# Patient Record
Sex: Female | Born: 1960 | Race: Black or African American | Hispanic: No | Marital: Married | Smoking: Never smoker
Health system: Southern US, Community
[De-identification: ages and names within clinical notes are randomized; demographics above are authoritative.]

---

## 1998-08-15 ENCOUNTER — Inpatient Hospital Stay (HOSPITAL_COMMUNITY): Admission: AD | Admit: 1998-08-15 | Discharge: 1998-08-15 | Payer: Self-pay | Admitting: *Deleted

## 1999-06-28 ENCOUNTER — Encounter: Payer: Self-pay | Admitting: General Surgery

## 1999-06-28 ENCOUNTER — Ambulatory Visit (HOSPITAL_COMMUNITY): Admission: RE | Admit: 1999-06-28 | Discharge: 1999-06-28 | Payer: Self-pay | Admitting: General Surgery

## 1999-06-28 ENCOUNTER — Encounter (INDEPENDENT_AMBULATORY_CARE_PROVIDER_SITE_OTHER): Payer: Self-pay

## 1999-09-23 ENCOUNTER — Inpatient Hospital Stay (HOSPITAL_COMMUNITY): Admission: AD | Admit: 1999-09-23 | Discharge: 1999-09-25 | Payer: Self-pay | Admitting: Obstetrics and Gynecology

## 2000-04-24 ENCOUNTER — Encounter: Payer: Self-pay | Admitting: Emergency Medicine

## 2000-04-24 ENCOUNTER — Encounter: Admission: RE | Admit: 2000-04-24 | Discharge: 2000-04-24 | Payer: Self-pay | Admitting: Emergency Medicine

## 2001-01-10 ENCOUNTER — Inpatient Hospital Stay (HOSPITAL_COMMUNITY): Admission: AD | Admit: 2001-01-10 | Discharge: 2001-01-12 | Payer: Self-pay | Admitting: Obstetrics and Gynecology

## 2001-02-14 ENCOUNTER — Encounter: Payer: Self-pay | Admitting: Obstetrics & Gynecology

## 2001-02-15 ENCOUNTER — Inpatient Hospital Stay (HOSPITAL_COMMUNITY): Admission: AD | Admit: 2001-02-15 | Discharge: 2001-02-20 | Payer: Self-pay | Admitting: Obstetrics & Gynecology

## 2001-09-11 ENCOUNTER — Other Ambulatory Visit: Admission: RE | Admit: 2001-09-11 | Discharge: 2001-09-11 | Payer: Self-pay | Admitting: Obstetrics and Gynecology

## 2001-09-16 ENCOUNTER — Encounter: Admission: RE | Admit: 2001-09-16 | Discharge: 2001-09-16 | Payer: Self-pay | Admitting: Obstetrics and Gynecology

## 2001-09-16 ENCOUNTER — Encounter: Payer: Self-pay | Admitting: Obstetrics and Gynecology

## 2003-03-21 ENCOUNTER — Encounter: Admission: RE | Admit: 2003-03-21 | Discharge: 2003-03-21 | Payer: Self-pay | Admitting: Emergency Medicine

## 2004-06-27 ENCOUNTER — Encounter: Admission: RE | Admit: 2004-06-27 | Discharge: 2004-06-27 | Payer: Self-pay | Admitting: Emergency Medicine

## 2005-03-27 ENCOUNTER — Emergency Department (HOSPITAL_COMMUNITY): Admission: EM | Admit: 2005-03-27 | Discharge: 2005-03-27 | Payer: Self-pay | Admitting: Emergency Medicine

## 2005-07-26 ENCOUNTER — Other Ambulatory Visit: Admission: RE | Admit: 2005-07-26 | Discharge: 2005-07-26 | Payer: Self-pay | Admitting: Obstetrics and Gynecology

## 2005-07-29 ENCOUNTER — Ambulatory Visit (HOSPITAL_COMMUNITY): Admission: RE | Admit: 2005-07-29 | Discharge: 2005-07-29 | Payer: Self-pay | Admitting: Obstetrics and Gynecology

## 2005-09-05 ENCOUNTER — Encounter: Admission: RE | Admit: 2005-09-05 | Discharge: 2005-09-05 | Payer: Self-pay | Admitting: Obstetrics and Gynecology

## 2005-12-29 ENCOUNTER — Emergency Department (HOSPITAL_COMMUNITY): Admission: EM | Admit: 2005-12-29 | Discharge: 2005-12-29 | Payer: Self-pay | Admitting: Family Medicine

## 2006-09-10 ENCOUNTER — Encounter: Admission: RE | Admit: 2006-09-10 | Discharge: 2006-09-10 | Payer: Self-pay | Admitting: Obstetrics and Gynecology

## 2006-11-20 ENCOUNTER — Encounter: Admission: RE | Admit: 2006-11-20 | Discharge: 2006-11-20 | Payer: Self-pay | Admitting: Occupational Medicine

## 2007-06-01 ENCOUNTER — Encounter: Admission: RE | Admit: 2007-06-01 | Discharge: 2007-06-01 | Payer: Self-pay | Admitting: Emergency Medicine

## 2007-11-09 ENCOUNTER — Encounter: Admission: RE | Admit: 2007-11-09 | Discharge: 2007-11-09 | Payer: Self-pay | Admitting: Emergency Medicine

## 2008-02-26 ENCOUNTER — Encounter: Admission: RE | Admit: 2008-02-26 | Discharge: 2008-02-26 | Payer: Self-pay | Admitting: Emergency Medicine

## 2009-03-13 ENCOUNTER — Emergency Department (HOSPITAL_COMMUNITY): Admission: EM | Admit: 2009-03-13 | Discharge: 2009-03-13 | Payer: Self-pay | Admitting: Family Medicine

## 2009-06-28 ENCOUNTER — Encounter: Admission: RE | Admit: 2009-06-28 | Discharge: 2009-06-28 | Payer: Self-pay | Admitting: Geriatric Medicine

## 2010-03-19 ENCOUNTER — Encounter
Admission: RE | Admit: 2010-03-19 | Discharge: 2010-03-19 | Payer: Self-pay | Source: Home / Self Care | Attending: Internal Medicine | Admitting: Internal Medicine

## 2010-04-29 ENCOUNTER — Encounter: Payer: Self-pay | Admitting: Obstetrics and Gynecology

## 2010-04-29 ENCOUNTER — Encounter: Payer: Self-pay | Admitting: Emergency Medicine

## 2010-08-24 NOTE — Discharge Summary (Signed)
[facility]  Patient:    Yolanda Roberson, Yolanda Roberson Visit Number: [id number] MRN: [id number]          Service Type: MED Location: [phone] Attending Physician:  Mickle Mallory Admit Date:  02/14/2001 Discharge Date: 02/20/2001                             Discharge Summary  DISCHARGE DIAGNOSES:          1. Postpartum endometritis.  PROCEDURE:                    IV antibiotic therapy, surgery consult, CT scan of abdomen.  HISTORY OF PRESENT ILLNESS:   Yolanda Roberson is a 50 year old black female, gravida 5, para 5, who had an uncomplicated delivery on January 10, 2001, by Yolanda Roberson, M.D.  The patient received Depo-Provera 150 mg IM prior to leaving on discharge.  The patient was doing well until approximately one day prior to admission when she began to have lower abdominal pain with associated nausea and vomiting.  When the patient presented to the emergency room, she was complaining of significant abdominal pain which was most significant in the right lower quadrant.  The patient was evaluated by Dr. Aldona Bar.  Her temperature on admission was 101.  Of significance on her abdominal examination was that her bowel sounds were decreased and that she was tender in the right lower quadrant, left lower quadrant, and less tender in the upper quadrants.  There was also rebound noted in the lower quadrants.  The pelvic examination was reported as being essentially normal.  The initial labs returned with a white count of 6.5, however, there was a left shift noted. Liver enzymes were normal.  Urinalysis was normal.  The patient also had a GC and Chlamydia obtained which were negative.  It was felt that the patients presentation was not consistent with an endometritis and it was worried that she might have an early appendicitis.  Surgery consult was obtained and a CT scan was ordered.  The CT scan revealed no evidence of appendicitis.  HOSPITAL COURSE:               During the hospitalization, for the first 48 hours, the patient was spiking temperatures up to 104.  On hospital day #2 tpx had blood cultures obtained and was begun on Cefotan for possible endometritis versus a viral syndrome.  The CT scan was also reviewed for septic pelvic thrombophlebitis which was negative.  While on the antibiotics, the patient defervesced and her left shift resolved and on hospital day #6, she was discharged to home with Darvocet to take p.r.n. and Keflex 500 mg q.i.d. for 5 days.  The patient was instructed to follow up in the office in 10 days. Attending Physician:  Mickle Mallory DD:  02/20/01 TD:  02/20/01 Job: [id number]

## 2011-06-06 ENCOUNTER — Ambulatory Visit
Admission: RE | Admit: 2011-06-06 | Discharge: 2011-06-06 | Disposition: A | Discharge status: Home / Self Care | Payer: BC Managed Care – PPO | Source: Ambulatory Visit | Attending: Internal Medicine | Admitting: Internal Medicine

## 2011-06-06 ENCOUNTER — Other Ambulatory Visit: Payer: Self-pay | Admitting: Internal Medicine

## 2011-06-06 DIAGNOSIS — Z1231 Encounter for screening mammogram for malignant neoplasm of breast: Secondary | ICD-10-CM

## 2011-07-24 ENCOUNTER — Telehealth: Payer: Self-pay

## 2011-07-24 NOTE — Telephone Encounter (Signed)
LM for pt to cb to discuss lab results. JO CMA

## 2011-08-02 ENCOUNTER — Telehealth: Payer: Self-pay

## 2011-08-02 NOTE — Telephone Encounter (Signed)
Late entry from 07/31/2011 re test results. Pt notified of all std screening results. Yolanda Roberson A

## 2012-08-20 ENCOUNTER — Other Ambulatory Visit: Payer: Self-pay

## 2012-08-20 DIAGNOSIS — Z1231 Encounter for screening mammogram for malignant neoplasm of breast: Secondary | ICD-10-CM

## 2012-09-23 ENCOUNTER — Ambulatory Visit: Payer: BC Managed Care – PPO

## 2012-09-24 ENCOUNTER — Ambulatory Visit
Admission: RE | Admit: 2012-09-24 | Discharge: 2012-09-24 | Disposition: A | Discharge status: Home / Self Care | Payer: BC Managed Care – PPO | Source: Ambulatory Visit

## 2012-09-24 DIAGNOSIS — Z1231 Encounter for screening mammogram for malignant neoplasm of breast: Secondary | ICD-10-CM

## 2013-09-03 ENCOUNTER — Emergency Department (INDEPENDENT_AMBULATORY_CARE_PROVIDER_SITE_OTHER)
Admission: EM | Admit: 2013-09-03 | Discharge: 2013-09-03 | Disposition: A | Discharge status: Home / Self Care | Payer: Worker's Compensation | Source: Home / Self Care | Attending: Emergency Medicine | Admitting: Emergency Medicine

## 2013-09-03 ENCOUNTER — Encounter (HOSPITAL_COMMUNITY): Payer: Self-pay | Admitting: Emergency Medicine

## 2013-09-03 DIAGNOSIS — Y93F2 Activity, caregiving, lifting: Secondary | ICD-10-CM

## 2013-09-03 DIAGNOSIS — M5430 Sciatica, unspecified side: Secondary | ICD-10-CM

## 2013-09-03 MED ORDER — TRAMADOL HCL 50 MG PO TABS: 100.0000 mg | ORAL_TABLET | Freq: Three times a day (TID) | ORAL | Status: DC | PRN

## 2013-09-03 MED ORDER — MELOXICAM 15 MG PO TABS: 15.0000 mg | ORAL_TABLET | Freq: Every day | ORAL | Status: DC

## 2013-09-03 MED ORDER — KETOROLAC TROMETHAMINE 60 MG/2ML IM SOLN
60.0000 mg | Freq: Once | INTRAMUSCULAR | Status: AC
  Administered 2013-09-03: 60 mg via INTRAMUSCULAR

## 2013-09-03 MED ORDER — METHYLPREDNISOLONE ACETATE 80 MG/ML IJ SUSP
INTRAMUSCULAR | Status: AC
  Filled 2013-09-03: qty 1

## 2013-09-03 MED ORDER — KETOROLAC TROMETHAMINE 60 MG/2ML IM SOLN
INTRAMUSCULAR | Status: AC
  Filled 2013-09-03: qty 2

## 2013-09-03 MED ORDER — METHOCARBAMOL 500 MG PO TABS: 500.0000 mg | ORAL_TABLET | Freq: Three times a day (TID) | ORAL | Status: DC

## 2013-09-03 MED ORDER — METHYLPREDNISOLONE ACETATE 80 MG/ML IJ SUSP
80.0000 mg | Freq: Once | INTRAMUSCULAR | Status: AC
  Administered 2013-09-03: 80 mg via INTRAMUSCULAR

## 2013-09-03 NOTE — ED Provider Notes (Signed)
Chief Complaint   Chief Complaint  Patient presents with  . 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 Immediately thereafter she noted pain in her right lower back with radiation down her right leg as far as the foot with numbness and tingling in the leg. There is no muscle weakness or foot drop. She denies any bladder or bowel dysfunction or saddle anesthesia. She's had no bowel pain, no fever, chills, weight loss. The pain is worse with bending, lifting, twisting. Nothing makes it better. She's had similar back injuries in the past but they have gotten better with time.  Review of Systems   Other than as noted above, the patient denies any of the following symptoms: Systemic:  No fever, chills, or unexplained weight loss. GI:  No abdominal painor incontinence of bowel. GU:  No dysuria, frequency, urgency, or hematuria. No incontinence of urine or urinary retention.  M-S:  No neck pain or arthritis. Neuro:  No paresthesias, headache, saddle anesthesia, muscular weakness, or progressive neurological deficit.  PMFSH   Past medical history, family history, social history, meds, and allergies were reviewed. Specifically, there is no history of cancer, major trauma, osteoporosis, immunosuppression, or HIV infection.   Physical Examination    Vital signs:  BP 117/81  Pulse 50  Temp(Src) 98.4 F (36.9 C) (Oral)  Resp 18  SpO2 100% General:  Alert, oriented, in no distress. Abdomen:  Soft, non-tender.  No organomegaly or mass.  No pulsatile midline abdominal mass or bruit. Back:  She has pain to palpation in her right lower lumbar area. Her back has a limited range of motion with 30 of forward flexion, 10 of backward flexion, 20 of lateral bending, and 30 of rotation with pain. Straight leg raising was positive on the right with pain only in the back and not down the leg and  negative on the left. Neuro:  Normal muscle strength, sensations and DTRs. Extremities: Pedal pulses were full, there was no edema. Skin:  Clear, warm and dry.  No rash.  Course in Urgent Care Center   Given Toradol 60 mg IM and Depo-Medrol 80 mg IM.    Assessment   The encounter diagnosis was Sciatica.  No evidence of cauda equina syndrome.  Plan     1.  Meds:  The following meds were prescribed:   Discharge Medication List as of 09/03/2013  7:34 PM    START taking these medications   Details  meloxicam (MOBIC) 15 MG tablet Take 1 tablet (15 mg total) by mouth daily., Starting 09/03/2013, Until Discontinued, Normal    methocarbamol (ROBAXIN) 500 MG tablet Take 1 tablet (500 mg total) by mouth 3 (three) times daily., Starting 09/03/2013, Until Discontinued, Normal    traMADol (ULTRAM) 50 MG tablet Take 2 tablets (100 mg total) by mouth every 8 (eight) hours as needed., Starting 09/03/2013, Until Discontinued, Normal        2.  Patient Education/Counseling:  The patient was given appropriate handouts, self care instructions, and instructed in symptomatic relief. The patient was encouraged to try to be as active as possible and given some exercises to do followed by moist heat.   3.  Follow up:  The patient was told to follow up here if no better in 3 to 4 days, or sooner if becoming worse in any way, and given some red flag symptoms  such as worsening pain or new neurological symptoms which would prompt immediate return.  Follow up early next week at occupational health clinic.     Reuben Likesavid C Nakaila Freeze, MD 09/03/13 2128

## 2013-09-03 NOTE — Discharge Instructions (Signed)
Do exercises twice daily followed by moist heat for 15 minutes. ° ° ° ° ° °Try to be as active as possible. ° °If no better in 2 weeks, follow up with orthopedist. ° ° °

## 2013-09-03 NOTE — ED Notes (Signed)
Patient states that she hurt her back at work this morning; states pain in lower back; states she was picking up a patient at work when injuring back.

## 2015-05-30 ENCOUNTER — Other Ambulatory Visit: Payer: Self-pay | Admitting: Occupational Medicine

## 2015-05-30 ENCOUNTER — Ambulatory Visit: Payer: Self-pay

## 2015-05-30 DIAGNOSIS — M25531 Pain in right wrist: Secondary | ICD-10-CM

## 2015-07-17 ENCOUNTER — Other Ambulatory Visit: Payer: Self-pay | Admitting: Gastroenterology

## 2015-07-17 DIAGNOSIS — R933 Abnormal findings on diagnostic imaging of other parts of digestive tract: Secondary | ICD-10-CM

## 2015-08-11 ENCOUNTER — Ambulatory Visit
Admission: RE | Admit: 2015-08-11 | Discharge: 2015-08-11 | Disposition: A | Discharge status: Home / Self Care | Payer: BLUE CROSS/BLUE SHIELD | Source: Ambulatory Visit | Attending: Gastroenterology | Admitting: Gastroenterology

## 2015-08-11 DIAGNOSIS — R933 Abnormal findings on diagnostic imaging of other parts of digestive tract: Secondary | ICD-10-CM

## 2018-11-26 DIAGNOSIS — Z03818 Encounter for observation for suspected exposure to other biological agents ruled out: Secondary | ICD-10-CM | POA: Diagnosis not present

## 2019-02-13 DIAGNOSIS — Z03818 Encounter for observation for suspected exposure to other biological agents ruled out: Secondary | ICD-10-CM | POA: Diagnosis not present

## 2019-02-24 DIAGNOSIS — Z03818 Encounter for observation for suspected exposure to other biological agents ruled out: Secondary | ICD-10-CM | POA: Diagnosis not present

## 2019-03-01 DIAGNOSIS — Z03818 Encounter for observation for suspected exposure to other biological agents ruled out: Secondary | ICD-10-CM | POA: Diagnosis not present

## 2019-03-15 DIAGNOSIS — Z03818 Encounter for observation for suspected exposure to other biological agents ruled out: Secondary | ICD-10-CM | POA: Diagnosis not present

## 2019-03-29 DIAGNOSIS — Z03818 Encounter for observation for suspected exposure to other biological agents ruled out: Secondary | ICD-10-CM | POA: Diagnosis not present

## 2019-04-06 DIAGNOSIS — Z20828 Contact with and (suspected) exposure to other viral communicable diseases: Secondary | ICD-10-CM | POA: Diagnosis not present

## 2019-04-21 DIAGNOSIS — Z03818 Encounter for observation for suspected exposure to other biological agents ruled out: Secondary | ICD-10-CM | POA: Diagnosis not present

## 2019-04-26 DIAGNOSIS — Z03818 Encounter for observation for suspected exposure to other biological agents ruled out: Secondary | ICD-10-CM | POA: Diagnosis not present

## 2020-03-20 DIAGNOSIS — Z1231 Encounter for screening mammogram for malignant neoplasm of breast: Secondary | ICD-10-CM | POA: Diagnosis not present

## 2020-03-21 DIAGNOSIS — Z780 Asymptomatic menopausal state: Secondary | ICD-10-CM | POA: Diagnosis not present

## 2020-03-21 DIAGNOSIS — Z30432 Encounter for removal of intrauterine contraceptive device: Secondary | ICD-10-CM | POA: Diagnosis not present

## 2020-03-27 DIAGNOSIS — Z01419 Encounter for gynecological examination (general) (routine) without abnormal findings: Secondary | ICD-10-CM | POA: Diagnosis not present

## 2020-03-27 DIAGNOSIS — E559 Vitamin D deficiency, unspecified: Secondary | ICD-10-CM | POA: Diagnosis not present

## 2020-03-27 DIAGNOSIS — Z124 Encounter for screening for malignant neoplasm of cervix: Secondary | ICD-10-CM | POA: Diagnosis not present

## 2020-03-27 DIAGNOSIS — Z139 Encounter for screening, unspecified: Secondary | ICD-10-CM | POA: Diagnosis not present

## 2020-08-17 ENCOUNTER — Other Ambulatory Visit: Payer: Self-pay | Admitting: Family Medicine

## 2020-08-17 ENCOUNTER — Ambulatory Visit
Admission: RE | Admit: 2020-08-17 | Discharge: 2020-08-17 | Disposition: A | Discharge status: Home / Self Care | Payer: BC Managed Care – PPO | Source: Ambulatory Visit | Attending: Family Medicine | Admitting: Family Medicine

## 2020-08-17 DIAGNOSIS — R519 Headache, unspecified: Secondary | ICD-10-CM | POA: Diagnosis not present

## 2020-08-17 DIAGNOSIS — Z0000 Encounter for general adult medical examination without abnormal findings: Secondary | ICD-10-CM | POA: Diagnosis not present

## 2020-08-17 DIAGNOSIS — Z23 Encounter for immunization: Secondary | ICD-10-CM | POA: Diagnosis not present

## 2020-08-17 DIAGNOSIS — D164 Benign neoplasm of bones of skull and face: Secondary | ICD-10-CM | POA: Diagnosis not present

## 2020-08-17 DIAGNOSIS — Q799 Congenital malformation of musculoskeletal system, unspecified: Secondary | ICD-10-CM

## 2020-08-17 DIAGNOSIS — Z113 Encounter for screening for infections with a predominantly sexual mode of transmission: Secondary | ICD-10-CM | POA: Diagnosis not present

## 2020-08-21 DIAGNOSIS — Z0000 Encounter for general adult medical examination without abnormal findings: Secondary | ICD-10-CM | POA: Diagnosis not present

## 2020-08-21 DIAGNOSIS — Z1159 Encounter for screening for other viral diseases: Secondary | ICD-10-CM | POA: Diagnosis not present

## 2020-08-21 DIAGNOSIS — Z113 Encounter for screening for infections with a predominantly sexual mode of transmission: Secondary | ICD-10-CM | POA: Diagnosis not present

## 2020-08-21 DIAGNOSIS — Z13220 Encounter for screening for lipoid disorders: Secondary | ICD-10-CM | POA: Diagnosis not present

## 2020-08-25 DIAGNOSIS — D72819 Decreased white blood cell count, unspecified: Secondary | ICD-10-CM | POA: Diagnosis not present

## 2020-09-15 DIAGNOSIS — E786 Lipoprotein deficiency: Secondary | ICD-10-CM | POA: Diagnosis not present

## 2020-09-15 DIAGNOSIS — R519 Headache, unspecified: Secondary | ICD-10-CM | POA: Diagnosis not present

## 2020-12-20 DIAGNOSIS — Z23 Encounter for immunization: Secondary | ICD-10-CM | POA: Diagnosis not present

## 2021-02-06 DIAGNOSIS — M79604 Pain in right leg: Secondary | ICD-10-CM | POA: Diagnosis not present

## 2021-02-16 DIAGNOSIS — H40003 Preglaucoma, unspecified, bilateral: Secondary | ICD-10-CM | POA: Diagnosis not present

## 2021-03-29 DIAGNOSIS — M79604 Pain in right leg: Secondary | ICD-10-CM | POA: Diagnosis not present

## 2021-04-05 ENCOUNTER — Ambulatory Visit
Admission: RE | Admit: 2021-04-05 | Discharge: 2021-04-05 | Disposition: A | Discharge status: Home / Self Care | Payer: BC Managed Care – PPO | Source: Ambulatory Visit | Attending: Sports Medicine | Admitting: Sports Medicine

## 2021-04-05 ENCOUNTER — Other Ambulatory Visit: Payer: Self-pay | Admitting: Sports Medicine

## 2021-04-05 DIAGNOSIS — M545 Low back pain, unspecified: Secondary | ICD-10-CM

## 2021-04-05 DIAGNOSIS — M25551 Pain in right hip: Secondary | ICD-10-CM

## 2021-04-12 ENCOUNTER — Other Ambulatory Visit: Payer: Self-pay | Admitting: Sports Medicine

## 2021-04-12 DIAGNOSIS — M4316 Spondylolisthesis, lumbar region: Secondary | ICD-10-CM

## 2021-04-26 DIAGNOSIS — M4316 Spondylolisthesis, lumbar region: Secondary | ICD-10-CM | POA: Diagnosis not present

## 2021-04-26 DIAGNOSIS — M5416 Radiculopathy, lumbar region: Secondary | ICD-10-CM | POA: Diagnosis not present

## 2021-05-06 ENCOUNTER — Ambulatory Visit
Admission: RE | Admit: 2021-05-06 | Discharge: 2021-05-06 | Disposition: A | Discharge status: Home / Self Care | Payer: BC Managed Care – PPO | Source: Ambulatory Visit | Attending: Sports Medicine | Admitting: Sports Medicine

## 2021-05-06 DIAGNOSIS — M4316 Spondylolisthesis, lumbar region: Secondary | ICD-10-CM

## 2021-05-24 DIAGNOSIS — M9905 Segmental and somatic dysfunction of pelvic region: Secondary | ICD-10-CM | POA: Diagnosis not present

## 2021-05-24 DIAGNOSIS — M9903 Segmental and somatic dysfunction of lumbar region: Secondary | ICD-10-CM | POA: Diagnosis not present

## 2021-05-24 DIAGNOSIS — M9904 Segmental and somatic dysfunction of sacral region: Secondary | ICD-10-CM | POA: Diagnosis not present

## 2021-05-24 DIAGNOSIS — M5431 Sciatica, right side: Secondary | ICD-10-CM | POA: Diagnosis not present

## 2021-05-28 DIAGNOSIS — M545 Low back pain, unspecified: Secondary | ICD-10-CM | POA: Diagnosis not present

## 2021-05-28 DIAGNOSIS — M79604 Pain in right leg: Secondary | ICD-10-CM | POA: Diagnosis not present

## 2021-06-04 DIAGNOSIS — M545 Low back pain, unspecified: Secondary | ICD-10-CM | POA: Diagnosis not present

## 2021-06-04 DIAGNOSIS — M79604 Pain in right leg: Secondary | ICD-10-CM | POA: Diagnosis not present

## 2021-06-13 DIAGNOSIS — M5416 Radiculopathy, lumbar region: Secondary | ICD-10-CM | POA: Diagnosis not present

## 2021-06-21 DIAGNOSIS — M5416 Radiculopathy, lumbar region: Secondary | ICD-10-CM | POA: Diagnosis not present

## 2021-07-11 DIAGNOSIS — M5416 Radiculopathy, lumbar region: Secondary | ICD-10-CM | POA: Diagnosis not present

## 2021-07-11 DIAGNOSIS — Z6831 Body mass index (BMI) 31.0-31.9, adult: Secondary | ICD-10-CM | POA: Diagnosis not present

## 2021-07-11 DIAGNOSIS — R030 Elevated blood-pressure reading, without diagnosis of hypertension: Secondary | ICD-10-CM | POA: Diagnosis not present

## 2021-08-17 DIAGNOSIS — M5416 Radiculopathy, lumbar region: Secondary | ICD-10-CM | POA: Diagnosis not present

## 2021-08-24 DIAGNOSIS — Z13220 Encounter for screening for lipoid disorders: Secondary | ICD-10-CM | POA: Diagnosis not present

## 2021-08-24 DIAGNOSIS — T7840XA Allergy, unspecified, initial encounter: Secondary | ICD-10-CM | POA: Diagnosis not present

## 2021-08-24 DIAGNOSIS — Z0000 Encounter for general adult medical examination without abnormal findings: Secondary | ICD-10-CM | POA: Diagnosis not present

## 2021-08-24 DIAGNOSIS — H409 Unspecified glaucoma: Secondary | ICD-10-CM | POA: Diagnosis not present

## 2021-11-12 DIAGNOSIS — M4316 Spondylolisthesis, lumbar region: Secondary | ICD-10-CM | POA: Diagnosis not present

## 2021-11-12 DIAGNOSIS — M25851 Other specified joint disorders, right hip: Secondary | ICD-10-CM | POA: Diagnosis not present

## 2021-11-12 DIAGNOSIS — M79604 Pain in right leg: Secondary | ICD-10-CM | POA: Diagnosis not present

## 2021-11-12 DIAGNOSIS — M5416 Radiculopathy, lumbar region: Secondary | ICD-10-CM | POA: Diagnosis not present

## 2021-11-19 NOTE — Therapy (Unsigned)
OUTPATIENT PHYSICAL THERAPY THORACOLUMBAR EVALUATION   Patient Name: Yolanda Roberson MRN: [id number] DOB:1960/07/25, 61 y.o., female Today's Date: 11/26/2021   PT End of Session - 11/26/21 1633     Visit Number 1    Number of Visits 16    Date for PT Re-Evaluation 01/28/22    Authorization Type BCBS COMM PPO    PT Start Time 1515    PT Stop Time 1600    PT Time Calculation (min) 45 min    Activity Tolerance Patient limited by pain    Behavior During Therapy [facility] for tasks assessed/performed            No past medical history on file. No past surgical history on file. There are no problems to display for this patient.   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 She states that it has progressively gotten worse resulting in lateral pain from her foot to her R hip and into her lower back Numbness and tingling present only at the dorsum of her foot. She reports remainder of lateral pain being sharp.   PAIN:  Are you having pain? Yes: NPRS scale: 7/10 Pain location: R lateral leg, dorsum of foot.  Pain description: Sharp pain in lateral R leg, N/T in dorsum of foot. Pulling in HS Aggravating factors: Any  movement.  Relieving factors: Nothing   PRECAUTIONS: None  WEIGHT BEARING RESTRICTIONS No  FALLS:  Has patient fallen in last 6 months? No  LIVING ENVIRONMENT: Lives with: lives with their family Lives in: House/apartment Stairs: Yes: Internal: 12 steps; on right going up Has following equipment at home: None  OCCUPATION: Med taker.   PLOF: Independent  PATIENT GOALS Pt would like to eliminate her pain.    OBJECTIVE:   DIAGNOSTIC FINDINGS:  X- ray done of lumbar spine. No interpretation present yet.   PATIENT  SURVEYS:  FOTO 41%, predicted 47% in 12 visits.   SCREENING FOR RED FLAGS: Bowel or bladder incontinence: No Spinal tumors: No Cauda equina syndrome: No Compression fracture: No Abdominal aneurysm: No  COGNITION:  Overall cognitive status: Within functional limits for tasks assessed     SENSATION: WFL   POSTURE: No Significant postural limitations  PALPATION: Pt tender to palpation along entire R lateral LE.  Tenderness in lateral lumbar paraspinals.   LUMBAR ROM:   Active  A/PROM  eval  Flexion WFL  Extension 10  Right lateral flexion To knee joint line Pain in R side  Left lateral flexion To knee joint line Pain in R side  Right rotation WFL   Left rotation WFL pulling on R side.    (Blank rows = not tested)  LOWER EXTREMITY ROM:     Active  Right eval Left eval  Hip flexion [facility]  Hip extension    Hip abduction    Hip adduction    Hip internal rotation PROM 40 PROM 25  Hip external rotation PROM 2 PROM 15  Knee flexion [facility] WFL  Knee extension WFL WFL   (Blank rows = not tested)  LOWER EXTREMITY MMT:    MMT Right eval Left eval  Hip flexion 3- 3+  Hip extension    Hip abduction    Hip adduction    Hip internal rotation    Hip external rotation    Knee flexion 4- 4+  Knee extension 4- 5   (Blank rows = not tested)  LUMBAR SPECIAL TESTS:  FABER test: Positive and R SCOURS: Negative  FUNCTIONAL TESTS:  None  performed due to time and significant pain.   GAIT: Distance walked: Within clinic (64ft)  Assistive device utilized: None Level of assistance: Complete Independence Comments: Pt with lateral lean to L with decreased step length and stance time.   TODAY'S TREATMENT  Creating, reviewing, and completing below HEP. Heat to R lower back.   PATIENT EDUCATION:  Education details: Educated pt on anatomy and physiology of current symptoms, FOTO, diagnosis, prognosis, HEP,  and POC. Person educated: Patient Education method: Medical illustrator Education comprehension: verbalized understanding and returned demonstration   HOME EXERCISE PROGRAM: Access Code: [email or web address] URL: [email or web address] Date: 11/26/2021 Prepared by: Royal Hawthorn  Exercises - Supine Lower Trunk Rotation  - 1 x daily - 7 x weekly - 3 sets - 10 reps  ASSESSMENT:  CLINICAL IMPRESSION: Patient is a 61 y.o. 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 Pt with decreased lumbar extension, and hip flexion secondary to pain. Pt is very limited with her functional movements, restricting her from participating in daily and recreational tasks. Pt will benefit from skilled PT to address deficits.   OBJECTIVE IMPAIRMENTS Abnormal gait, decreased activity tolerance, decreased balance, decreased endurance, decreased knowledge of condition, decreased mobility, difficulty walking, decreased ROM, decreased strength, obesity, and pain.   ACTIVITY LIMITATIONS carrying, lifting, bending, sitting, standing, squatting, sleeping, stairs, bed mobility, bathing, dressing, and locomotion level  PARTICIPATION LIMITATIONS: meal prep, cleaning, laundry, driving, community activity, and occupation  PERSONAL FACTORS Education and  Profession are also affecting patient's functional outcome.   REHAB POTENTIAL: Good  CLINICAL DECISION MAKING: Evolving/moderate complexity  EVALUATION COMPLEXITY: Moderate   GOALS: Goals reviewed with patient? No  SHORT TERM GOALS: Target date: 12/24/2021  Pt will be I  and compliant with initial HEP. Baseline: Goal status: INITIAL  2.  Pt will report </= 3/10 pain at baseline   Baseline:7/10 Goal status: INITIAL  LONG TERM GOALS: Target date: 01/21/2022  Pt will be independent with advanced HEP to continue to address postural limitations and muscle imbalances. Baseline:  Goal status: INITIAL  2.  Pt will report </= 3/10 pain at baseline    Baseline: Goal status: INITIAL  3.  Pt will improve FOTO function score to no less than 47% as proxy for functional improvement Baseline:  Goal status: INITIAL  4.  Pt will be able to run errands for her family without familiar pain.  Baseline: Unable to do anything beyond work.  Goal status: INITIAL  5.  Pt will improve her MMT's to 4/5 globally.  Baseline: See chart above  Goal status: INITIAL  6.  Pt will ambulate with normal gait pattern.  Baseline:  Goal status: INITIAL   PLAN: PT FREQUENCY: 2x/week  PT DURATION: 8 weeks  PLANNED INTERVENTIONS: Therapeutic exercises, Therapeutic activity, Neuromuscular re-education, Balance training, Gait training, Patient/Family education, Self Care, Joint mobilization, Joint manipulation, Stair training, Aquatic Therapy, Dry Needling, Electrical stimulation, Spinal manipulation, Spinal mobilization, Cryotherapy, Moist heat, Taping, Vasopneumatic device, Traction, Ultrasound, Biofeedback, Ionotophoresis 4mg /ml Dexamethasone, Manual therapy, and Re-evaluation.  PLAN FOR NEXT SESSION: Assess HEP/update PRN, continue to progress functional mobility, strengthen core, paraspinals, proximal hip, and lower extremity muscles. Decrease patients pain and help minimize pain with functional  movements.    , PT 11/26/2021, 4:34 PM

## 2021-11-20 ENCOUNTER — Ambulatory Visit: Payer: Self-pay

## 2021-11-20 ENCOUNTER — Ambulatory Visit: Payer: BC Managed Care – PPO | Admitting: Surgery

## 2021-11-20 DIAGNOSIS — M5416 Radiculopathy, lumbar region: Secondary | ICD-10-CM

## 2021-11-26 ENCOUNTER — Other Ambulatory Visit: Payer: Self-pay

## 2021-11-26 ENCOUNTER — Ambulatory Visit (INDEPENDENT_AMBULATORY_CARE_PROVIDER_SITE_OTHER): Payer: BC Managed Care – PPO | Admitting: Physical Therapy

## 2021-11-26 DIAGNOSIS — M79651 Pain in right thigh: Secondary | ICD-10-CM

## 2021-11-26 DIAGNOSIS — M5459 Other low back pain: Secondary | ICD-10-CM | POA: Diagnosis not present

## 2021-11-26 DIAGNOSIS — M6281 Muscle weakness (generalized): Secondary | ICD-10-CM

## 2021-11-26 DIAGNOSIS — M25551 Pain in right hip: Secondary | ICD-10-CM

## 2021-11-26 DIAGNOSIS — R2689 Other abnormalities of gait and mobility: Secondary | ICD-10-CM

## 2021-11-26 DIAGNOSIS — M79671 Pain in right foot: Secondary | ICD-10-CM

## 2021-12-04 ENCOUNTER — Encounter (HOSPITAL_BASED_OUTPATIENT_CLINIC_OR_DEPARTMENT_OTHER): Payer: Self-pay | Admitting: Physical Therapy

## 2021-12-04 ENCOUNTER — Ambulatory Visit (HOSPITAL_BASED_OUTPATIENT_CLINIC_OR_DEPARTMENT_OTHER): Payer: BC Managed Care – PPO | Attending: Family Medicine | Admitting: Physical Therapy

## 2021-12-04 DIAGNOSIS — M5459 Other low back pain: Secondary | ICD-10-CM | POA: Insufficient documentation

## 2021-12-04 DIAGNOSIS — M79671 Pain in right foot: Secondary | ICD-10-CM | POA: Insufficient documentation

## 2021-12-04 DIAGNOSIS — M6281 Muscle weakness (generalized): Secondary | ICD-10-CM | POA: Diagnosis not present

## 2021-12-04 DIAGNOSIS — M79651 Pain in right thigh: Secondary | ICD-10-CM | POA: Diagnosis not present

## 2021-12-04 DIAGNOSIS — M25551 Pain in right hip: Secondary | ICD-10-CM | POA: Diagnosis not present

## 2021-12-04 NOTE — Therapy (Unsigned)
OUTPATIENT PHYSICAL THERAPY TREATMENT NOTE   Patient Name: Yolanda Roberson MRN: [id number] DOB:1961-01-02, 61 y.o., female Today's Date: 12/06/2021  PCP:  Aliene Beams, MD  REFERRING PROVIDER: Carilyn Goodpasture, NP  END OF SESSION:   PT End of Session - 12/06/21 1614     Visit Number 3    Number of Visits 16    Date for PT Re-Evaluation 01/28/22    Authorization Type BCBS COMM PPO    PT Start Time 1520    PT Stop Time 1605    PT Time Calculation (min) 45 min    Activity Tolerance Patient tolerated treatment well    Behavior During Therapy WFL for tasks assessed/performed             History reviewed. No pertinent past medical history. History reviewed. No pertinent surgical history. There are no problems to display for this patient.   REFERRING DIAG: Spondylolisthesis, lumbar region [M43.16], Radiculopathy, lumbar region [M54.16], Other specified joint disorders, right hip [M25.851], Pain in right leg [M79.604]  THERAPY DIAG:  Other low back pain  Pain in right hip  Pain in right thigh  Pain in right foot  Muscle weakness (generalized)  Other abnormalities of gait and mobility  Rationale for Evaluation and Treatment Rehabilitation   PERTINENT HISTORY: None  PRECAUTIONS: None  ONSET DATE: a year ago   SUBJECTIVE:                                                                                                                                                                                            SUBJECTIVE STATEMENT: Pt reports to PT with continued pain in her R hip and R LE into her anterior tib.   Eval:  Pt states that she had an insidious onset of R dorsum foot pain that started about a year ago. She states that it has progressively gotten worse resulting in lateral pain from her foot to her R hip and into her lower back Numbness and tingling present only at the dorsum of her foot. She reports remainder of lateral pain being sharp.    PAIN:  Are you  having pain? Yes: NPRS scale: 7/10 Pain location: R lateral leg, dorsum of foot.  Pain description: Sharp pain in lateral R leg, N/T in dorsum of foot. Pulling in HS Aggravating factors: Any movement.  Relieving factors: Nothing  OBJECTIVE:    DIAGNOSTIC FINDINGS:  X- ray done of lumbar spine. No interpretation present yet.    PATIENT SURVEYS:  FOTO 41%, predicted 47% in 12 visits.    SCREENING FOR RED FLAGS: Bowel or bladder incontinence: No Spinal tumors: No Cauda  equina syndrome: No Compression fracture: No Abdominal aneurysm: No   COGNITION:           Overall cognitive status: Within functional limits for tasks assessed                          SENSATION: WFL     POSTURE: No Significant postural limitations   PALPATION: Pt tender to palpation along entire R lateral LE.  Tenderness in lateral lumbar paraspinals.    LUMBAR ROM:    Active  A/PROM  eval  Flexion WFL  Extension 10  Right lateral flexion To knee joint line Pain in R side  Left lateral flexion To knee joint line Pain in R side  Right rotation WFL   Left rotation WFL pulling on R side.    (Blank rows = not tested)   LOWER EXTREMITY ROM:      Active  Right eval Left eval  Hip flexion [facility]  Hip extension      Hip abduction      Hip adduction      Hip internal rotation PROM 40 PROM 25  Hip external rotation PROM 2 PROM 15  Knee flexion [facility] WFL  Knee extension WFL WFL   (Blank rows = not tested)   LOWER EXTREMITY MMT:     MMT Right eval Left eval  Hip flexion 3- 3+  Hip extension      Hip abduction      Hip adduction      Hip internal rotation      Hip external rotation      Knee flexion 4- 4+  Knee extension 4- 5   (Blank rows = not tested)   LUMBAR SPECIAL TESTS:  FABER test: Positive and R SCOURS: Negative   FUNCTIONAL TESTS:  None performed due to time and significant pain.    GAIT: Distance walked: Within clinic (30ft)  Assistive device utilized: None Level of  assistance: Complete Independence Comments: Pt with lateral lean to L with decreased step length and stance time.    TODAY'S TREATMENT  [facility] Adult PT Treatment:                                                DATE: 12/06/2021 Therapeutic Exercise: Recumbent bike 5 min LTR x 20  Common peroneal nerve glides x20  Manual Therapy: Theragun to R glute, R lat and lateral gastorc  Thoracolumbar rotational stretch Filbular AP mobs Grade II Trigger Point Dry-Needling  Treatment instructions: Expect mild to moderate muscle soreness. S/S of pneumothorax if dry needled over a lung field, and to seek immediate medical attention should they occur. Patient verbalized understanding of these instructions and education.  Patient Consent Given: Yes Education handout provided: No Muscles treated: R Lateral gastroc  Electrical stimulation performed: No Parameters: N/A Treatment response/outcome: Pt reports less notable symptoms.     PATIENT EDUCATION:  Education details: Educated pt on anatomy and physiology of current symptoms, FOTO, diagnosis, prognosis, HEP,  and POC. Person educated: Patient Education method: Medical illustrator Education comprehension: verbalized understanding and returned demonstration     HOME EXERCISE PROGRAM: Access Code: [email or web address] URL: [email or web address] Date: 11/26/2021 Prepared by: Royal Hawthorn   Exercises - Supine Lower Trunk Rotation  - 1 x daily - 7 x weekly - 3 sets - 10 reps  ASSESSMENT:   CLINICAL IMPRESSION: Patient reports to her first follow up appt with continued pain in her R LE. Upon further examination, pt seems to have two separate issues going on. Peroneal impingement at her knee with significant tension noted in her lateral gastoc. Pt has secondary pain into her R hip due to her antalgic gait pattern, resulting in tight lats and glutes. Pt reports mild pain relief with TPDN and peroneal glides. Pt educated on importance of  stretching intermittently throughout the day. Sje tolerated session well today, with slight relief in pain at end of session. Pt will continue to benefit from skilled PT to address continued deficits.    OBJECTIVE IMPAIRMENTS Abnormal gait, decreased activity tolerance, decreased balance, decreased endurance, decreased knowledge of condition, decreased mobility, difficulty walking, decreased ROM, decreased strength, obesity, and pain.    ACTIVITY LIMITATIONS carrying, lifting, bending, sitting, standing, squatting, sleeping, stairs, bed mobility, bathing, dressing, and locomotion level   PARTICIPATION LIMITATIONS: meal prep, cleaning, laundry, driving, community activity, and occupation   PERSONAL FACTORS Education and Profession are also affecting patient's functional outcome.    REHAB POTENTIAL: Good   CLINICAL DECISION MAKING: Evolving/moderate complexity   EVALUATION COMPLEXITY: Moderate     GOALS: Goals reviewed with patient? No   SHORT TERM GOALS: Target date: 12/24/2021   Pt will be I and compliant with initial HEP. Baseline: Goal status: INITIAL   2.  Pt will report </= 3/10 pain at baseline             Baseline:7/10 Goal status: INITIAL   LONG TERM GOALS: Target date: 01/21/2022   Pt will be independent with advanced HEP to continue to address postural limitations and muscle imbalances. Baseline:  Goal status: INITIAL   2.  Pt will report </= 3/10 pain at baseline                        Baseline: Goal status: INITIAL   3.  Pt will improve FOTO function score to no less than 47% as proxy for functional improvement Baseline:  Goal status: INITIAL   4.  Pt will be able to run errands for her family without familiar pain.  Baseline: Unable to do anything beyond work.  Goal status: INITIAL   5.  Pt will improve her MMT's to 4/5 globally.  Baseline: See chart above  Goal status: INITIAL   6.  Pt will ambulate with normal gait pattern.  Baseline:  Goal status:  INITIAL     PLAN: PT FREQUENCY: 2x/week   PT DURATION: 8 weeks   PLANNED INTERVENTIONS: Therapeutic exercises, Therapeutic activity, Neuromuscular re-education, Balance training, Gait training, Patient/Family education, Self Care, Joint mobilization, Joint manipulation, Stair training, Aquatic Therapy, Dry Needling, Electrical stimulation, Spinal manipulation, Spinal mobilization, Cryotherapy, Moist heat, Taping, Vasopneumatic device, Traction, Ultrasound, Biofeedback, Ionotophoresis 4mg /ml Dexamethasone, Manual therapy, and Re-evaluation.   PLAN FOR NEXT SESSION: Assess HEP/update PRN, continue to progress functional mobility, strengthen core, paraspinals, proximal hip, and lower extremity muscles. Decrease patients pain and help minimize pain with functional movements. Assess response to TPDN.      Lynden Ang, PT 12/06/2021, 4:16 PM

## 2021-12-04 NOTE — Therapy (Signed)
OUTPATIENT PHYSICAL THERAPY THORACOLUMBAR TREATMENT NOTE   Patient Name: Yolanda Roberson MRN: [id number] DOB:1960/06/17, 61 y.o., female Today's Date: 12/04/2021   PT End of Session - 12/04/21 1704     Visit Number 2    Number of Visits 16    Date for PT Re-Evaluation 01/28/22    Authorization Type BCBS COMM PPO    PT Start Time 1704    PT Stop Time 1744    PT Time Calculation (min) 40 min    Activity Tolerance Patient limited by pain    Behavior During Therapy [facility] for tasks assessed/performed            History reviewed. No pertinent past medical history. History reviewed. No pertinent surgical history. There are no problems to display for this patient.   PCP: Aliene Beams, MD  REFERRING PROVIDER: Carilyn Goodpasture, NP  REFERRING DIAG: Spondylolisthesis, lumbar region [M43.16], Radiculopathy, lumbar region [M54.16], Other specified joint disorders, right hip [M25.851], Pain in right leg [M79.604]  Rationale for Evaluation and Treatment Rehabilitation  THERAPY DIAG:  Other low back pain  Pain in right hip  Pain in right thigh  Pain in right foot  Muscle weakness (generalized)  ONSET DATE: a year ago  SUBJECTIVE:                                                                                                                                                                                           SUBJECTIVE STATEMENT: Pt reports her pain in Rt LE remains high. She has gotten a membership to the [facility] to get into water; is hopeful it will give her relief.   PAIN:  Are you having pain? Yes: NPRS scale: 8/10 Pain location: R hip down lateral leg to calf  Pain description: Sharp pain in lateral R leg Aggravating factors: Any movement.  Relieving factors: Nothing   PRECAUTIONS: None  WEIGHT BEARING RESTRICTIONS No  FALLS:  Has patient fallen in last 6 months? No  LIVING ENVIRONMENT: Lives with: lives with their family Lives in: House/apartment Stairs:  Yes: Internal: 12 steps; on right going up Has following equipment at home: None  OCCUPATION: Med taker.   PLOF: Independent  PATIENT GOALS Pt would like to eliminate her pain.    OBJECTIVE:   DIAGNOSTIC FINDINGS:  X- ray done of lumbar spine. No interpretation present yet.   PATIENT SURVEYS:  FOTO 41%, predicted 47% in 12 visits.   SCREENING FOR RED FLAGS: Bowel or bladder incontinence: No Spinal tumors: No Cauda equina syndrome: No Compression fracture: No Abdominal aneurysm: No  COGNITION:  Overall cognitive status: Within functional limits for tasks assessed  SENSATION: WFL   POSTURE: No Significant postural limitations  PALPATION: Pt tender to palpation along entire R lateral LE.  Tenderness in lateral lumbar paraspinals.   LUMBAR ROM:   Active  A/PROM  eval  Flexion WFL  Extension 10  Right lateral flexion To knee joint line Pain in R side  Left lateral flexion To knee joint line Pain in R side  Right rotation WFL   Left rotation WFL pulling on R side.    (Blank rows = not tested)  LOWER EXTREMITY ROM:     Active  Right eval Left eval  Hip flexion [facility]  Hip extension    Hip abduction    Hip adduction    Hip internal rotation PROM 40 PROM 25  Hip external rotation PROM 2 PROM 15  Knee flexion [facility] WFL  Knee extension WFL WFL   (Blank rows = not tested)  LOWER EXTREMITY MMT:    MMT Right eval Left eval  Hip flexion 3- 3+  Hip extension    Hip abduction    Hip adduction    Hip internal rotation    Hip external rotation    Knee flexion 4- 4+  Knee extension 4- 5   (Blank rows = not tested)  LUMBAR SPECIAL TESTS:  FABER test: Positive and R SCOURS: Negative  FUNCTIONAL TESTS:  None performed due to time and significant pain.   GAIT: Distance walked: Within clinic (49ft)  Assistive device utilized: None Level of assistance: Complete Independence Comments: Pt with lateral lean to L with decreased step length and stance  time.   TODAY'S TREATMENT  Pt seen for aquatic therapy today.  Treatment took place in water 3.25-4.5 ft in depth at the [facility] pool. Temp of water was 91.  Pt entered/exited the pool via stairs with supervision with bilat rail.  * intro to [employer] * holding onto white barbell -> yellow noodle->  HHA :  forward walking, backward walking, side stepping, high knee marching forward/ backward.   * holding wall:  squats (cues for form);  hip abdct/add  * return to walking forward/ backward with HHA * seated on bench in water with feet on blue step:  Rt glute / piriformis stretch x 3 reps, LLE x 1 rep;  Rt hamstring stretch with RLE on bench in single leg long sitting  Pt requires the buoyancy and hydrostatic pressure of water for support, and to offload joints by unweighting joint load by at least 50 % in navel deep water and by at least 75-80% in chest to neck deep water.  Viscosity of the water is needed for resistance of strengthening. Water current perturbations provides challenge to standing balance requiring increased core activation.    PATIENT EDUCATION:  Education details: Educated pt on anatomy and physiology of current symptoms, FOTO, diagnosis, prognosis, HEP,  and POC. Person educated: Patient Education method: Medical illustrator Education comprehension: verbalized understanding and returned demonstration   HOME EXERCISE PROGRAM: Access Code: [email or web address] URL: [email or web address] Date: 11/26/2021 Prepared by: Royal Hawthorn  Exercises - Supine Lower Trunk Rotation  - 1 x daily - 7 x weekly - 3 sets - 10 reps  ASSESSMENT:  CLINICAL IMPRESSION: Pt does well with HHA of therapist in water, to improve her confidence in the water and to encourage or more relaxed gait.  Rt hip appears very tight with ROM/ stretches.  She reported reduction of RLE pain to 6/10 while in the water.  Pt will benefit from skilled PT  to address deficits and improve  her functional mobility.   OBJECTIVE IMPAIRMENTS Abnormal gait, decreased activity tolerance, decreased balance, decreased endurance, decreased knowledge of condition, decreased mobility, difficulty walking, decreased ROM, decreased strength, obesity, and pain.   ACTIVITY LIMITATIONS carrying, lifting, bending, sitting, standing, squatting, sleeping, stairs, bed mobility, bathing, dressing, and locomotion level  PARTICIPATION LIMITATIONS: meal prep, cleaning, laundry, driving, community activity, and occupation  PERSONAL FACTORS Education and Profession are also affecting patient's functional outcome.   REHAB POTENTIAL: Good  CLINICAL DECISION MAKING: Evolving/moderate complexity  EVALUATION COMPLEXITY: Moderate   GOALS: Goals reviewed with patient? No  SHORT TERM GOALS: Target date: 01/01/2022  Pt will be I and compliant with initial HEP. Baseline: Goal status: INITIAL  2.  Pt will report </= 3/10 pain at baseline   Baseline:7/10 Goal status: INITIAL  LONG TERM GOALS: Target date: 01/29/2022  Pt will be independent with advanced HEP to continue to address postural limitations and muscle imbalances. Baseline:  Goal status: INITIAL  2.  Pt will report </= 3/10 pain at baseline    Baseline: Goal status: INITIAL  3.  Pt will improve FOTO function score to no less than 47% as proxy for functional improvement Baseline:  Goal status: INITIAL  4.  Pt will be able to run errands for her family without familiar pain.  Baseline: Unable to do anything beyond work.  Goal status: INITIAL  5.  Pt will improve her MMT's to 4/5 globally.  Baseline: See chart above  Goal status: INITIAL  6.  Pt will ambulate with normal gait pattern.  Baseline:  Goal status: INITIAL   PLAN: PT FREQUENCY: 2x/week  PT DURATION: 8 weeks  PLANNED INTERVENTIONS: Therapeutic exercises, Therapeutic activity, Neuromuscular re-education, Balance training, Gait training, Patient/Family education,  Self Care, Joint mobilization, Joint manipulation, Stair training, Aquatic Therapy, Dry Needling, Electrical stimulation, Spinal manipulation, Spinal mobilization, Cryotherapy, Moist heat, Taping, Vasopneumatic device, Traction, Ultrasound, Biofeedback, Ionotophoresis 4mg /ml Dexamethasone, Manual therapy, and Re-evaluation.  PLAN FOR NEXT SESSION: Assess response to aquatics.  Update HEP PRN, continue to progress functional mobility, strengthen core, paraspinals, proximal hip, and lower extremity muscles. Decrease patients pain and help minimize pain with functional movements.  , PTA 12/04/21 6:24 PM

## 2021-12-05 DIAGNOSIS — M5416 Radiculopathy, lumbar region: Secondary | ICD-10-CM | POA: Diagnosis not present

## 2021-12-06 ENCOUNTER — Encounter: Payer: Self-pay | Admitting: Physical Therapy

## 2021-12-06 ENCOUNTER — Ambulatory Visit: Payer: BC Managed Care – PPO | Admitting: Physical Therapy

## 2021-12-06 DIAGNOSIS — M25551 Pain in right hip: Secondary | ICD-10-CM

## 2021-12-06 DIAGNOSIS — M5459 Other low back pain: Secondary | ICD-10-CM | POA: Diagnosis not present

## 2021-12-06 DIAGNOSIS — M79651 Pain in right thigh: Secondary | ICD-10-CM

## 2021-12-06 DIAGNOSIS — M79671 Pain in right foot: Secondary | ICD-10-CM

## 2021-12-06 DIAGNOSIS — R2689 Other abnormalities of gait and mobility: Secondary | ICD-10-CM

## 2021-12-06 DIAGNOSIS — M6281 Muscle weakness (generalized): Secondary | ICD-10-CM

## 2021-12-12 ENCOUNTER — Ambulatory Visit: Payer: BC Managed Care – PPO | Admitting: Orthopaedic Surgery

## 2021-12-12 ENCOUNTER — Encounter: Payer: Self-pay | Admitting: Orthopaedic Surgery

## 2021-12-12 DIAGNOSIS — M4316 Spondylolisthesis, lumbar region: Secondary | ICD-10-CM

## 2021-12-12 MED ORDER — MELOXICAM 15 MG PO TABS: 15.0000 mg | ORAL_TABLET | Freq: Every day | ORAL | 0 refills | Status: DC

## 2021-12-12 NOTE — Progress Notes (Signed)
Office Visit Note   Patient: Yolanda Roberson           Date of Birth: 07/16/1960           MRN: [id number] Visit Date: 12/12/2021              Requested by: Aliene Beams, MD [id number] [place],  [place] [address] PCP: Aliene Beams, MD   Assessment & Plan: Visit Diagnoses:  1. Spondylolisthesis, lumbar region     Plan: Reviewed MRI scan with patient gave her a copy of report.  She has wide facet joints at L5-S1 with 15 to 16 mm of anterolisthesis disc bulging and lateral recess narrowing with some mild central narrowing.  In supine position she only has 4 mm anterolisthesis with standing she has to 15.  Patient was requesting some type of pill that will take care of her problem and I discussed with her in detail that with the instability that she has at this level her symptoms will progress.  Other levels above look good.  She can return in a month with her husband so that he also can be any included in the discussion about her problem.  Follow-Up Instructions: Return in about 1 month (around 01/11/2022).   Orders:  No orders of the defined types were placed in this encounter.  Meds ordered this encounter  Medications   meloxicam (MOBIC) 15 MG tablet    Sig: Take 1 tablet (15 mg total) by mouth daily.    Dispense:  15 tablet    Refill:  0      Procedures: No procedures performed   Clinical Data: No additional findings.   Subjective: Chief Complaint  Patient presents with   Lower Back - Pain, Follow-up    HPI 61 year old female with chronic back pain referred by PCP for evaluation.  She has had previous epidural injections recent recommendation referral for pain management.  Patient has a 15 mm anterolisthesis which shifts to 16 mm of flexion-extension images by lateral flexion-extension lumbar views done on 11/20/2021.  Patient has back pain pain that radiates down her right leg.  Patient states she has increased pain with standing more than 1520 minutes.  Pain  with ambulation she gets relief when she sits.  Pain is intermittent sometimes variable.  First epidural gave her no relief second gave her relief for couple weeks.  She states she is already been scheduled for third injection in November.  She has used methocarbamol 3 times a day which helps some.  Also has had tramadol, Mobic.  Review of Systems all other systems noncontributory to HPI.   Objective: Vital Signs: BP 118/81   Pulse 61   Ht 5\' 5"  (1.651 m)   Wt 204 lb (92.5 kg)   BMI 33.95 kg/m   Physical Exam Constitutional:      Appearance: She is well-developed.  HENT:     Head: Normocephalic.     Right Ear: External ear normal.     Left Ear: External ear normal. There is no impacted cerumen.  Eyes:     Pupils: Pupils are equal, round, and reactive to light.  Neck:     Thyroid: No thyromegaly.     Trachea: No tracheal deviation.  Cardiovascular:     Rate and Rhythm: Normal rate.  Pulmonary:     Effort: Pulmonary effort is normal.  Abdominal:     Palpations: Abdomen is soft.  Musculoskeletal:     Cervical back: No rigidity.  Skin:  General: Skin is warm and dry.  Neurological:     Mental Status: She is alert and oriented to person, place, and time.  Psychiatric:        Behavior: Behavior normal.     Ortho Exam patient has some sciatic notch tenderness pain extremity raising 90 degrees.  Knee and ankle jerk are intact she is able to heel and toe walk.  Specialty Comments:  No specialty comments available.  Imaging: Narrative & Impression  CLINICAL DATA:  Right leg pain, weakness, and numbness   EXAM: MRI LUMBAR SPINE WITHOUT CONTRAST   TECHNIQUE: Multiplanar, multisequence MR imaging of the lumbar spine was performed. No intravenous contrast was administered.   COMPARISON:  06/01/2007 MRI, correlation is also made with 04/05/2021 lumbar spine radiographs   FINDINGS: Segmentation:  Standard.   Alignment: 4 mm anterolisthesis L5 on S1, which is new from  the prior MRI. Mild dextrocurvature.   Vertebrae:  No acute fracture or suspicious osseous lesion.   Conus medullaris and cauda equina: Conus extends to the L1-L2 level. Conus and cauda equina appear normal.   Paraspinal and other soft tissues: Small renal cysts.   Disc levels:   T12-L1: No significant disc bulge. No spinal canal stenosis or neural foraminal narrowing.   L1-L2: No significant disc bulge. No spinal canal stenosis or neural foraminal narrowing.   L2-L3: Mild disc desiccation. No significant disc bulge. Mild facet arthropathy. No spinal canal stenosis or neural foraminal narrowing.   L3-L4: Mild disc desiccation. No significant disc bulge. Mild facet arthropathy. No spinal canal stenosis or neural foraminal narrowing.   L4-L5: No significant disc bulge. Mild-to-moderate facet arthropathy. No spinal canal stenosis or neural foraminal narrowing.   L5-S1: Disc desiccation, anterolisthesis with disc unroofing, and moderate disc bulge. Severe facet arthropathy, which is new from the prior exam, with a posteriorly directed synovial cyst extending from the left facets; widening of the facets. Narrowing of the lateral recesses. No spinal canal stenosis. Mild left and moderate right neural foraminal narrowing.   IMPRESSION: 1. L5-S1 moderate right and mild left neural foraminal narrowing. Narrowing of the lateral recesses at this level could also affect the descending S1 nerve roots. In addition, there is severe facet arthropathy, which can independently be a cause of pain, with widening of the facets, which can indicate instability. These findings are all new compared to the prior exam. 2. No spinal canal stenosis.     Electronically Signed   By: Wiliam Ke M.D.   On: 05/08/2021 03:35       PMFS History: Patient Active Problem List   Diagnosis Date Noted   Spondylolisthesis, lumbar region 12/13/2021   No past medical history on file.  No family  history on file.  No past surgical history on file. Social History   Occupational History   Not on file  Tobacco Use   Smoking status: Never   Smokeless tobacco: Not on file  Substance and Sexual Activity   Alcohol use: No   Drug use: No   Sexual activity: Not on file

## 2021-12-13 DIAGNOSIS — M4316 Spondylolisthesis, lumbar region: Secondary | ICD-10-CM | POA: Insufficient documentation

## 2021-12-17 ENCOUNTER — Encounter: Payer: Self-pay | Admitting: Physical Therapy

## 2021-12-17 ENCOUNTER — Ambulatory Visit (INDEPENDENT_AMBULATORY_CARE_PROVIDER_SITE_OTHER): Payer: BC Managed Care – PPO | Admitting: Physical Therapy

## 2021-12-17 DIAGNOSIS — M6281 Muscle weakness (generalized): Secondary | ICD-10-CM

## 2021-12-17 DIAGNOSIS — M5459 Other low back pain: Secondary | ICD-10-CM

## 2021-12-17 DIAGNOSIS — M79671 Pain in right foot: Secondary | ICD-10-CM | POA: Diagnosis not present

## 2021-12-17 DIAGNOSIS — M79651 Pain in right thigh: Secondary | ICD-10-CM | POA: Diagnosis not present

## 2021-12-17 DIAGNOSIS — M25551 Pain in right hip: Secondary | ICD-10-CM

## 2021-12-17 DIAGNOSIS — R2689 Other abnormalities of gait and mobility: Secondary | ICD-10-CM

## 2021-12-17 NOTE — Therapy (Signed)
OUTPATIENT PHYSICAL THERAPY TREATMENT NOTE   Patient Name: Yolanda Roberson MRN: [id number] DOB:Dec 06, 1960, 61 y.o., female Today's Date: 12/17/2021  PCP:  Aliene Beams, MD  REFERRING PROVIDER: Carilyn Goodpasture, NP  END OF SESSION:   PT End of Session - 12/17/21 1551     Visit Number 4    Number of Visits 16    Date for PT Re-Evaluation 01/28/22    Authorization Type BCBS COMM PPO    PT Start Time 1550    PT Stop Time 1635    PT Time Calculation (min) 45 min    Activity Tolerance Patient tolerated treatment well    Behavior During Therapy [facility] for tasks assessed/performed             History reviewed. No pertinent past medical history. History reviewed. No pertinent surgical history. Patient Active Problem List   Diagnosis Date Noted   Spondylolisthesis, lumbar region 12/13/2021    REFERRING DIAG: Spondylolisthesis, lumbar region [M43.16], Radiculopathy, lumbar region [M54.16], Other specified joint disorders, right hip [M25.851], Pain in right leg [M79.604]  THERAPY DIAG:  Other low back pain  Pain in right hip  Pain in right thigh  Pain in right foot  Muscle weakness (generalized)  Other abnormalities of gait and mobility  Rationale for Evaluation and Treatment Rehabilitation   PERTINENT HISTORY: None  PRECAUTIONS: None  ONSET DATE: a year ago   SUBJECTIVE:                                                                                                                                                                                            SUBJECTIVE STATEMENT: Pt reports pain is in her Rt leg today mostly in her calf and lower lateral leg. She feels the DN was helpful from last time so she would like this performed again.  Eval:  Pt states that she had an insidious onset of R dorsum foot pain that started about a year ago. She states that it has progressively gotten worse resulting in lateral pain from her foot to her R hip and into her lower back  Numbness and tingling present only at the dorsum of her foot. She reports remainder of lateral pain being sharp.    PAIN:  Are you having pain? Yes: NPRS scale: 7/10 Pain location: R lateral leg, dorsum of foot.  Pain description: Sharp pain in lateral R leg, N/T in dorsum of foot. Pulling in HS Aggravating factors: Any movement.  Relieving factors: Nothing  OBJECTIVE:    DIAGNOSTIC FINDINGS:  X- ray done of lumbar spine. No interpretation present yet.    PATIENT SURVEYS:  FOTO 41%,  predicted 47% in 12 visits.    SCREENING FOR RED FLAGS: Bowel or bladder incontinence: No Spinal tumors: No Cauda equina syndrome: No Compression fracture: No Abdominal aneurysm: No   COGNITION:           Overall cognitive status: Within functional limits for tasks assessed                          SENSATION: WFL     POSTURE: No Significant postural limitations   PALPATION: Pt tender to palpation along entire R lateral LE.  Tenderness in lateral lumbar paraspinals.    LUMBAR ROM:    Active  A/PROM  eval  Flexion WFL  Extension 10  Right lateral flexion To knee joint line Pain in R side  Left lateral flexion To knee joint line Pain in R side  Right rotation WFL   Left rotation WFL pulling on R side.    (Blank rows = not tested)   LOWER EXTREMITY ROM:      Active  Right eval Left eval  Hip flexion [facility]  Hip extension      Hip abduction      Hip adduction      Hip internal rotation PROM 40 PROM 25  Hip external rotation PROM 2 PROM 15  Knee flexion [facility] WFL  Knee extension WFL WFL   (Blank rows = not tested)   LOWER EXTREMITY MMT:     MMT Right eval Left eval  Hip flexion 3- 3+  Hip extension      Hip abduction      Hip adduction      Hip internal rotation      Hip external rotation      Knee flexion 4- 4+  Knee extension 4- 5   (Blank rows = not tested)   LUMBAR SPECIAL TESTS:  FABER test: Positive and R SCOURS: Negative   FUNCTIONAL TESTS:  None performed  due to time and significant pain.    GAIT: Distance walked: Within clinic (88ft)  Assistive device utilized: None Level of assistance: Complete Independence Comments: Pt with lateral lean to L with decreased step length and stance time.    TODAY'S TREATMENT  12/17/21 -Manual therapy for skilled palpation and Trigger Point Dry-Needling  Treatment instructions: Expect mild to moderate muscle soreness. Patient Consent Given: Yes Education handout provided: verbally provided Muscles treated:R Lateral gastroc-solus, and peroneals Treatment response/outcome: good overall tolerance,twitch response noted   -Supine gastroc stretch with strap 3X30 sec -Supine Hamstring stretch with strap3X30 sec -Supine piriformis stretch on Rt 3 X 30 -Supine LTR 5 sec X 10 bilat -Supine bridges 5 sec X 10 -Seated sciatic/peroneal nerve 3 sec X 10 on left each -Standing hip abduction X 15 bilat -Standing hip extension X 15 bilat   [facility] Adult PT Treatment:                                                DATE: 12/06/2021 Therapeutic Exercise: Recumbent bike 5 min LTR x 20  Common peroneal nerve glides x20  Manual Therapy: Theragun to R glute, R lat and lateral gastorc  Thoracolumbar rotational stretch Filbular AP mobs Grade II Trigger Point Dry-Needling  Treatment instructions: Expect mild to moderate muscle soreness. S/S of pneumothorax if dry needled over a lung field, and to seek immediate  medical attention should they occur. Patient verbalized understanding of these instructions and education.  Patient Consent Given: Yes Education handout provided: No Muscles treated: R Lateral gastroc  Electrical stimulation performed: No Parameters: N/A Treatment response/outcome: Pt reports less notable symptoms.     PATIENT EDUCATION:  Education details: Educated pt on anatomy and physiology of current symptoms, FOTO, diagnosis, prognosis, HEP,  and POC. Person educated: Patient Education method:  Customer service manager Education comprehension: verbalized understanding and returned demonstration     HOME EXERCISE PROGRAM: Access Code: [email or web address] URL: [email or web address] Date: 11/26/2021 Prepared by: Rudi Heap   Exercises - Supine Lower Trunk Rotation  - 1 x daily - 7 x weekly - 3 sets - 10 reps   ASSESSMENT:   CLINICAL IMPRESSION: She continues to have pain and antalgic gait on Rt LE. Continued with DN today as she reports this was helpful from last session. Then focused on lumbar/LE mobility, stretching, and gentle strengthening to her tolerance.   OBJECTIVE IMPAIRMENTS Abnormal gait, decreased activity tolerance, decreased balance, decreased endurance, decreased knowledge of condition, decreased mobility, difficulty walking, decreased ROM, decreased strength, obesity, and pain.    ACTIVITY LIMITATIONS carrying, lifting, bending, sitting, standing, squatting, sleeping, stairs, bed mobility, bathing, dressing, and locomotion level   PARTICIPATION LIMITATIONS: meal prep, cleaning, laundry, driving, community activity, and occupation   PERSONAL FACTORS Education and Profession are also affecting patient's functional outcome.    REHAB POTENTIAL: Good   CLINICAL DECISION MAKING: Evolving/moderate complexity   EVALUATION COMPLEXITY: Moderate     GOALS: Goals reviewed with patient? No   SHORT TERM GOALS: Target date: 12/24/2021   Pt will be I and compliant with initial HEP. Baseline: Goal status: INITIAL   2.  Pt will report </= 3/10 pain at baseline             Baseline:7/10 Goal status: INITIAL   LONG TERM GOALS: Target date: 01/21/2022   Pt will be independent with advanced HEP to continue to address postural limitations and muscle imbalances. Baseline:  Goal status: INITIAL   2.  Pt will report </= 3/10 pain at baseline                        Baseline: Goal status: INITIAL   3.  Pt will improve FOTO function score to no less than 47%  as proxy for functional improvement Baseline:  Goal status: INITIAL   4.  Pt will be able to run errands for her family without familiar pain.  Baseline: Unable to do anything beyond work.  Goal status: INITIAL   5.  Pt will improve her MMT's to 4/5 globally.  Baseline: See chart above  Goal status: INITIAL   6.  Pt will ambulate with normal gait pattern.  Baseline:  Goal status: INITIAL     PLAN: PT FREQUENCY: 2x/week   PT DURATION: 8 weeks   PLANNED INTERVENTIONS: Therapeutic exercises, Therapeutic activity, Neuromuscular re-education, Balance training, Gait training, Patient/Family education, Self Care, Joint mobilization, Joint manipulation, Stair training, Aquatic Therapy, Dry Needling, Electrical stimulation, Spinal manipulation, Spinal mobilization, Cryotherapy, Moist heat, Taping, Vasopneumatic device, Traction, Ultrasound, Biofeedback, Ionotophoresis 4mg /ml Dexamethasone, Manual therapy, and Re-evaluation.   PLAN FOR NEXT SESSION: Assess HEP/update PRN, continue to progress functional mobility, strengthen core, paraspinals, proximal hip, and lower extremity muscles. Decrease patients pain and help minimize pain with functional movements. Assess response to TPDN.      Debbe Odea, PT,DPT 12/17/2021, 3:52 PM

## 2021-12-20 NOTE — Therapy (Unsigned)
OUTPATIENT PHYSICAL THERAPY TREATMENT NOTE   Patient Name: Yolanda Roberson MRN: [id number] DOB:11/11/1960, 61 y.o., female Today's Date: 12/24/2021  PCP:  Aliene Beams, MD  REFERRING PROVIDER: Carilyn Goodpasture, NP  END OF SESSION:   PT End of Session - 12/24/21 1302     Visit Number 5    Number of Visits 16    Date for PT Re-Evaluation 01/28/22    Authorization Type BCBS COMM PPO    PT Start Time 1302    PT Stop Time 1345    PT Time Calculation (min) 43 min    Activity Tolerance Patient tolerated treatment well    Behavior During Therapy WFL for tasks assessed/performed              History reviewed. No pertinent past medical history. History reviewed. No pertinent surgical history. Patient Active Problem List   Diagnosis Date Noted   Spondylolisthesis, lumbar region 12/13/2021    REFERRING DIAG: Spondylolisthesis, lumbar region [M43.16], Radiculopathy, lumbar region [M54.16], Other specified joint disorders, right hip [M25.851], Pain in right leg [M79.604]  THERAPY DIAG:  Other low back pain  Pain in right hip  Pain in right thigh  Pain in right foot  Muscle weakness (generalized)  Other abnormalities of gait and mobility  Rationale for Evaluation and Treatment Rehabilitation   PERTINENT HISTORY: None  PRECAUTIONS: None  ONSET DATE: a year ago   SUBJECTIVE:                                                                                                                                                                                            SUBJECTIVE STATEMENT: Pt reports pain is in her Rt leg today mostly in her calf and lower lateral leg. She feels the DN was helpful from last time so she would like this performed again.  Eval:  Pt states that she had an insidious onset of R dorsum foot pain that started about a year ago. She states that it has progressively gotten worse resulting in lateral pain from her foot to her R hip and into her lower back  Numbness and tingling present only at the dorsum of her foot. She reports remainder of lateral pain being sharp.    PAIN:  Are you having pain? Yes: NPRS scale: 7/10 Pain location: R lateral leg, dorsum of foot.  Pain description: Sharp pain in lateral R leg, N/T in dorsum of foot. Pulling in HS Aggravating factors: Any movement.  Relieving factors: Nothing  OBJECTIVE:    DIAGNOSTIC FINDINGS:  X- ray done of lumbar spine. No interpretation present yet.    PATIENT SURVEYS:  FOTO  41%, predicted 47% in 12 visits.    SCREENING FOR RED FLAGS: Bowel or bladder incontinence: No Spinal tumors: No Cauda equina syndrome: No Compression fracture: No Abdominal aneurysm: No   COGNITION:           Overall cognitive status: Within functional limits for tasks assessed                          SENSATION: WFL     POSTURE: No Significant postural limitations   PALPATION: Pt tender to palpation along entire R lateral LE.  Tenderness in lateral lumbar paraspinals.    LUMBAR ROM:    Active  A/PROM  eval  Flexion WFL  Extension 10  Right lateral flexion To knee joint line Pain in R side  Left lateral flexion To knee joint line Pain in R side  Right rotation WFL   Left rotation WFL pulling on R side.    (Blank rows = not tested)   LOWER EXTREMITY ROM:      Active  Right eval Left eval  Hip flexion [facility]  Hip extension      Hip abduction      Hip adduction      Hip internal rotation PROM 40 PROM 25  Hip external rotation PROM 2 PROM 15  Knee flexion [facility] WFL  Knee extension WFL WFL   (Blank rows = not tested)   LOWER EXTREMITY MMT:     MMT Right eval Left eval  Hip flexion 3- 3+  Hip extension      Hip abduction      Hip adduction      Hip internal rotation      Hip external rotation      Knee flexion 4- 4+  Knee extension 4- 5   (Blank rows = not tested)   LUMBAR SPECIAL TESTS:  FABER test: Positive and R SCOURS: Negative   FUNCTIONAL TESTS:  None performed  due to time and significant pain.    GAIT: Distance walked: Within clinic (50ft)  Assistive device utilized: None Level of assistance: Complete Independence Comments: Pt with lateral lean to L with decreased step length and stance time.    TODAY'S TREATMENT  12/24/21 -Manual therapy for skilled palpation and Trigger Point Dry-Needling  Treatment instructions: Expect mild to moderate muscle soreness. Patient Consent Given: Yes Education handout provided: verbally provided Muscles treated:R Lateral gastroc-solus, and peroneals Treatment response/outcome: good overall tolerance,twitch response noted   - Graston to R gastroc - adhesions noted in muscle tissue.  - Supine PPT x20 - Pt reports some pain relief in her R hip. -Supine gastroc stretch with strap 3X30 sec -Supine Hamstring stretch with strap3X30 sec -Supine piriformis stretch on Rt 3 X 30 -Seated sciatic/peroneal nerve 3 sec X 10 on R each -Standing hip abduction X 15 bilat -Standing hip extension X 15 bilat  12/17/21 -Manual therapy for skilled palpation and Trigger Point Dry-Needling  Treatment instructions: Expect mild to moderate muscle soreness. Patient Consent Given: Yes Education handout provided: verbally provided Muscles treated:R Lateral gastroc-solus, and peroneals Treatment response/outcome: good overall tolerance,twitch response noted   -Supine gastroc stretch with strap 3X30 sec -Supine Hamstring stretch with strap3X30 sec -Supine piriformis stretch on Rt 3 X 30 -Supine LTR 5 sec X 10 bilat -Supine bridges 5 sec X 10 -Seated sciatic/peroneal nerve 3 sec X 10 on left each -Standing hip abduction X 15 bilat -Standing hip extension X 15 bilat  PATIENT EDUCATION:  Education details: Educated pt on anatomy and physiology of current symptoms, FOTO, diagnosis, prognosis, HEP,  and POC. Person educated: Patient Education method: Medical illustrator Education comprehension: verbalized  understanding and returned demonstration     HOME EXERCISE PROGRAM: Access Code: [email or web address] URL: [email or web address] Date: 11/26/2021 Prepared by: Royal Hawthorn   Exercises - Supine Lower Trunk Rotation  - 1 x daily - 7 x weekly - 3 sets - 10 reps  Updated HEP 12/24/2021:  Access Code: [email or web address] URL: [email or web address] Date: 12/24/2021 Prepared by: Royal Hawthorn  Exercises - Supine Lower Trunk Rotation  - 2 x daily - 7 x weekly - 2 sets - 10 reps - Clamshell  - 2 x daily - 7 x weekly - 2 sets - 10 reps - Supine Single Knee to Double Knee to Chest Stretch  - 2 x daily - 7 x weekly - 2 sets - 30 hold - Supine Piriformis Stretch with Foot on Ground  - 2 x daily - 7 x weekly - 2 sets - 30 hold - Supine Peroneal Nerve Glide  - 2 x daily - 7 x weekly - 2 sets - 10 reps - Towel Scrunches  - 2 x daily - 7 x weekly - 2 sets - 10 reps - Seated Marble Pick-Up with Toes  - 1 x daily - 7 x weekly - 1 sets - Supine Quadriceps Stretch with Strap on Table  - 2 x daily - 7 x weekly - 2 sets - 30 hold ASSESSMENT:   CLINICAL IMPRESSION: She continues to have pain and antalgic gait on Rt LE. Continued with DN today as she reports this was helpful from last session. Performed deep soft tissue mobilization with graston tool to [employer]. Pt wit significant tension noted in R LE. She reports mild pain relief with PPT's today. She was unable to perform bridges due to pain. Session with focus on lumbar/LE mobility, stretching, and gentle strengthening to her tolerance. Pt will continue to benefit from foot intrinsics strengthening to help with over pronation of foot. Pt will continue to benefit from skilled PT to address continued deficits.    OBJECTIVE IMPAIRMENTS Abnormal gait, decreased activity tolerance, decreased balance, decreased endurance, decreased knowledge of condition, decreased mobility, difficulty walking, decreased ROM, decreased strength, obesity, and pain.     ACTIVITY LIMITATIONS carrying, lifting, bending, sitting, standing, squatting, sleeping, stairs, bed mobility, bathing, dressing, and locomotion level   PARTICIPATION LIMITATIONS: meal prep, cleaning, laundry, driving, community activity, and occupation   PERSONAL FACTORS Education and Profession are also affecting patient's functional outcome.    REHAB POTENTIAL: Good   CLINICAL DECISION MAKING: Evolving/moderate complexity   EVALUATION COMPLEXITY: Moderate     GOALS: Goals reviewed with patient? No   SHORT TERM GOALS: Target date: 12/24/2021   Pt will be I and compliant with initial HEP. Baseline: Goal status: INITIAL   2.  Pt will report </= 3/10 pain at baseline             Baseline:7/10 Goal status: INITIAL   LONG TERM GOALS: Target date: 01/21/2022   Pt will be independent with advanced HEP to continue to address postural limitations and muscle imbalances. Baseline:  Goal status: INITIAL   2.  Pt will report </= 3/10 pain at baseline                        Baseline: Goal status: INITIAL   3.  Pt will improve FOTO function  score to no less than 47% as proxy for functional improvement Baseline:  Goal status: INITIAL   4.  Pt will be able to run errands for her family without familiar pain.  Baseline: Unable to do anything beyond work.  Goal status: INITIAL   5.  Pt will improve her MMT's to 4/5 globally.  Baseline: See chart above  Goal status: INITIAL   6.  Pt will ambulate with normal gait pattern.  Baseline:  Goal status: INITIAL     PLAN: PT FREQUENCY: 2x/week   PT DURATION: 8 weeks   PLANNED INTERVENTIONS: Therapeutic exercises, Therapeutic activity, Neuromuscular re-education, Balance training, Gait training, Patient/Family education, Self Care, Joint mobilization, Joint manipulation, Stair training, Aquatic Therapy, Dry Needling, Electrical stimulation, Spinal manipulation, Spinal mobilization, Cryotherapy, Moist heat, Taping, Vasopneumatic  device, Traction, Ultrasound, Biofeedback, Ionotophoresis 4mg /ml Dexamethasone, Manual therapy, and Re-evaluation.   PLAN FOR NEXT SESSION: Assess HEP/update PRN, continue to progress functional mobility, strengthen core, paraspinals, proximal hip, and lower extremity muscles. Decrease patients pain and help minimize pain with functional movements. Assess response to TPDN.      Lynden Ang, PT,DPT 12/24/2021, 1:51 PM

## 2021-12-24 ENCOUNTER — Encounter: Payer: Self-pay | Admitting: Physical Therapy

## 2021-12-24 ENCOUNTER — Ambulatory Visit (INDEPENDENT_AMBULATORY_CARE_PROVIDER_SITE_OTHER): Payer: BC Managed Care – PPO | Admitting: Physical Therapy

## 2021-12-24 DIAGNOSIS — M79671 Pain in right foot: Secondary | ICD-10-CM

## 2021-12-24 DIAGNOSIS — M25551 Pain in right hip: Secondary | ICD-10-CM

## 2021-12-24 DIAGNOSIS — M79651 Pain in right thigh: Secondary | ICD-10-CM | POA: Diagnosis not present

## 2021-12-24 DIAGNOSIS — M6281 Muscle weakness (generalized): Secondary | ICD-10-CM

## 2021-12-24 DIAGNOSIS — M5459 Other low back pain: Secondary | ICD-10-CM

## 2021-12-24 DIAGNOSIS — R2689 Other abnormalities of gait and mobility: Secondary | ICD-10-CM

## 2021-12-25 ENCOUNTER — Ambulatory Visit (HOSPITAL_BASED_OUTPATIENT_CLINIC_OR_DEPARTMENT_OTHER): Payer: BC Managed Care – PPO | Attending: Family Medicine | Admitting: Physical Therapy

## 2021-12-25 ENCOUNTER — Encounter (HOSPITAL_BASED_OUTPATIENT_CLINIC_OR_DEPARTMENT_OTHER): Payer: Self-pay | Admitting: Physical Therapy

## 2021-12-25 DIAGNOSIS — M6281 Muscle weakness (generalized): Secondary | ICD-10-CM | POA: Diagnosis not present

## 2021-12-25 DIAGNOSIS — M79671 Pain in right foot: Secondary | ICD-10-CM | POA: Diagnosis not present

## 2021-12-25 DIAGNOSIS — M25551 Pain in right hip: Secondary | ICD-10-CM | POA: Insufficient documentation

## 2021-12-25 DIAGNOSIS — M5459 Other low back pain: Secondary | ICD-10-CM | POA: Insufficient documentation

## 2021-12-25 DIAGNOSIS — M79651 Pain in right thigh: Secondary | ICD-10-CM | POA: Diagnosis not present

## 2021-12-25 NOTE — Therapy (Signed)
OUTPATIENT PHYSICAL THERAPY TREATMENT NOTE   Patient Name: Yolanda Roberson MRN: [id number] DOB:1961-01-15, 61 y.o., female Today's Date: 12/25/2021  PCP:  Aliene Beams, MD  REFERRING PROVIDER: Carilyn Goodpasture, NP  END OF SESSION:   PT End of Session - 12/25/21 1703     Visit Number 6    Number of Visits 16    Date for PT Re-Evaluation 01/28/22    Authorization Type BCBS COMM PPO    PT Start Time 1702    PT Stop Time 1744    PT Time Calculation (min) 42 min              No past medical history on file. No past surgical history on file. Patient Active Problem List   Diagnosis Date Noted   Spondylolisthesis, lumbar region 12/13/2021    REFERRING DIAG: Spondylolisthesis, lumbar region [M43.16], Radiculopathy, lumbar region [M54.16], Other specified joint disorders, right hip [M25.851], Pain in right leg [M79.604]  THERAPY DIAG:  No diagnosis found.  Rationale for Evaluation and Treatment Rehabilitation   PERTINENT HISTORY: None  PRECAUTIONS: None  ONSET DATE: a year ago   SUBJECTIVE:                                                                                                                                                                                            SUBJECTIVE STATEMENT: Pt reports she thinks she is getting better.  "The needles have helped".   She states she just finished work and her RLE is very painful.   Eval:  Pt states that she had an insidious onset of R dorsum foot pain that started about a year ago. She states that it has progressively gotten worse resulting in lateral pain from her foot to her R hip and into her lower back Numbness and tingling present only at the dorsum of her foot. She reports remainder of lateral pain being sharp.    PAIN:  Are you having pain? Yes: NPRS scale: 6/10 - at rest, 9/10 when walking  Pain location: R lateral leg, dorsum of foot.  Pain description: Sharp pain in lateral R leg, N/T in dorsum of foot.  Pulling in HS Aggravating factors: Any movement.  Relieving factors: Nothing  OBJECTIVE:    DIAGNOSTIC FINDINGS:  X- ray done of lumbar spine. No interpretation present yet.    PATIENT SURVEYS:  FOTO 41%, predicted 47% in 12 visits.    SCREENING FOR RED FLAGS: Bowel or bladder incontinence: No Spinal tumors: No Cauda equina syndrome: No Compression fracture: No Abdominal aneurysm: No   COGNITION:           Overall  cognitive status: Within functional limits for tasks assessed                          SENSATION: WFL     POSTURE: No Significant postural limitations   PALPATION: Pt tender to palpation along entire R lateral LE.  Tenderness in lateral lumbar paraspinals.    LUMBAR ROM:    Active  A/PROM  eval  Flexion WFL  Extension 10  Right lateral flexion To knee joint line Pain in R side  Left lateral flexion To knee joint line Pain in R side  Right rotation WFL   Left rotation WFL pulling on R side.    (Blank rows = not tested)   LOWER EXTREMITY ROM:      Active  Right eval Left eval  [facility]  Hip extension      Hip abduction      Hip adduction      Hip internal rotation PROM 40 PROM 25  Hip external rotation PROM 2 PROM 15  [facility]  Knee extension WFL WFL   (Blank rows = not tested)   LOWER EXTREMITY MMT:     MMT Right eval Left eval  Hip flexion 3- 3+  Hip extension      Hip abduction      Hip adduction      Hip internal rotation      Hip external rotation      Knee flexion 4- 4+  Knee extension 4- 5   (Blank rows = not tested)   LUMBAR SPECIAL TESTS:  FABER test: Positive and R SCOURS: Negative   FUNCTIONAL TESTS:  None performed due to time and significant pain.    GAIT: Distance walked: Within clinic (5850ft)  Assistive device utilized: None Level of assistance: Complete Independence Comments: Pt with lateral lean to L with decreased step length and stance time.     TODAY'S TREATMENT  9/19: Pt seen  for aquatic therapy today.  Treatment took place in water 3.25-4.5 ft in [facility] Temp of water was 93.  Pt entered/exited the pool via stairs with supervision with bilat rail.   *Holding wall: side stepping L/R; R hip ext x 10, R hip abdct x 10; bilat heel raises; Rt clam (in Lt SLS) - unable to tolerate L hip abdct due to pain in RLE in stance * holding onto white barbell (with SBA) :  forward walking, backward walking, side stepping, high knee marching forward/ backward.  - very guarded  * holding wall:  squats with heels lifting up, submerged to neck * straddling noodle in deeper water, holding wall:  cycling, cc ski, gentle jumping jack motion - 2 rounds  * suspended with noodle under arms behind back, along legs to dangle for spine/hip decompression  * holding rails :  fig 4 hip stretch x 1 rep each LE.    Pt requires the buoyancy and hydrostatic pressure of water for support, and to offload joints by unweighting joint load by at least 50 % in navel deep water and by at least 75-80% in chest to neck deep water.  Viscosity of the water is needed for resistance of strengthening. Water current perturbations provides challenge to standing balance requiring increased core activation.   12/24/21 -Manual therapy for skilled palpation and Trigger Point Dry-Needling  Treatment instructions: Expect mild to moderate muscle soreness. Patient Consent Given: Yes Education handout provided: verbally  provided Muscles treated:R Lateral gastroc-solus, and peroneals Treatment response/outcome: good overall tolerance,twitch response noted   - Graston to R gastroc - adhesions noted in muscle tissue.  - Supine PPT x20 - Pt reports some pain relief in her R hip. -Supine gastroc stretch with strap 3X30 sec -Supine Hamstring stretch with strap3X30 sec -Supine piriformis stretch on Rt 3 X 30 -Seated sciatic/peroneal nerve 3 sec X 10 on R each -Standing hip abduction X 15  bilat -Standing hip extension X 15 bilat  12/17/21 -Manual therapy for skilled palpation and Trigger Point Dry-Needling  Treatment instructions: Expect mild to moderate muscle soreness. Patient Consent Given: Yes Education handout provided: verbally provided Muscles treated:R Lateral gastroc-solus, and peroneals Treatment response/outcome: good overall tolerance,twitch response noted   -Supine gastroc stretch with strap 3X30 sec -Supine Hamstring stretch with strap3X30 sec -Supine piriformis stretch on Rt 3 X 30 -Supine LTR 5 sec X 10 bilat -Supine bridges 5 sec X 10 -Seated sciatic/peroneal nerve 3 sec X 10 on left each -Standing hip abduction X 15 bilat -Standing hip extension X 15 bilat      PATIENT EDUCATION:  Education details: Geographical information systems officer and progression  Person educated: Patient Education method: Customer service manager Education comprehension: verbalized understanding and returned demonstration     HOME EXERCISE PROGRAM: Access Code: [email or web address] URL: [email or web address] Date: 11/26/2021 Prepared by: Rudi Heap   Exercises - Supine Lower Trunk Rotation  - 1 x daily - 7 x weekly - 3 sets - 10 reps  Updated HEP 12/24/2021:  Access Code: [email or web address] URL: [email or web address] Date: 12/24/2021 Prepared by: Rudi Heap  Exercises - Supine Lower Trunk Rotation  - 2 x daily - 7 x weekly - 2 sets - 10 reps - Clamshell  - 2 x daily - 7 x weekly - 2 sets - 10 reps - Supine Single Knee to Double Knee to Chest Stretch  - 2 x daily - 7 x weekly - 2 sets - 30 hold - Supine Piriformis Stretch with Foot on Ground  - 2 x daily - 7 x weekly - 2 sets - 30 hold - Supine Peroneal Nerve Glide  - 2 x daily - 7 x weekly - 2 sets - 10 reps - Towel Scrunches  - 2 x daily - 7 x weekly - 2 sets - 10 reps - Seated Marble Pick-Up with Toes  - 1 x daily - 7 x weekly - 1 sets - Supine Quadriceps Stretch with Strap on Table  - 2 x daily - 7 x weekly - 2  sets - 30 hold ASSESSMENT:   CLINICAL IMPRESSION: Pt very guarded with gait in water at chest depth; cues for increase Lt step length with forward gait.  Pt reported reduction of pain to 2/10 while LE suspended in deeper water.  Pain stayed at lower level even after she exited water.  Pt observed flexing at back (without bending knees) to pick up shoes from ground.  Discussed working on better Economist for reaching items that are lower, as well as passing medicine out at work (mini squat vs only bending from back).  Pt will continue to benefit from foot intrinsics strengthening to help with over pronation of foot. Pt will continue to benefit from skilled PT to address continued deficits.    OBJECTIVE IMPAIRMENTS Abnormal gait, decreased activity tolerance, decreased balance, decreased endurance, decreased knowledge of condition, decreased mobility, difficulty walking, decreased ROM, decreased strength, obesity, and pain.    ACTIVITY LIMITATIONS carrying, lifting, bending, sitting, standing,  squatting, sleeping, stairs, bed mobility, bathing, dressing, and locomotion level   PARTICIPATION LIMITATIONS: meal prep, cleaning, laundry, driving, community activity, and occupation   PERSONAL FACTORS Education and Profession are also affecting patient's functional outcome.    REHAB POTENTIAL: Good   CLINICAL DECISION MAKING: Evolving/moderate complexity   EVALUATION COMPLEXITY: Moderate     GOALS: Goals reviewed with patient? No   SHORT TERM GOALS: Target date: 12/24/2021   Pt will be I and compliant with initial HEP. Baseline: Goal status: INITIAL   2.  Pt will report </= 3/10 pain at baseline             Baseline:7/10 Goal status: INITIAL   LONG TERM GOALS: Target date: 01/21/2022   Pt will be independent with advanced HEP to continue to address postural limitations and muscle imbalances. Baseline:  Goal status: INITIAL   2.  Pt will report </= 3/10 pain at baseline                         Baseline: Goal status: INITIAL   3.  Pt will improve FOTO function score to no less than 47% as proxy for functional improvement Baseline:  Goal status: INITIAL   4.  Pt will be able to run errands for her family without familiar pain.  Baseline: Unable to do anything beyond work.  Goal status: INITIAL   5.  Pt will improve her MMT's to 4/5 globally.  Baseline: See chart above  Goal status: INITIAL   6.  Pt will ambulate with normal gait pattern.  Baseline:  Goal status: INITIAL     PLAN: PT FREQUENCY: 2x/week   PT DURATION: 8 weeks   PLANNED INTERVENTIONS: Therapeutic exercises, Therapeutic activity, Neuromuscular re-education, Balance training, Gait training, Patient/Family education, Self Care, Joint mobilization, Joint manipulation, Stair training, Aquatic Therapy, Dry Needling, Electrical stimulation, Spinal manipulation, Spinal mobilization, Cryotherapy, Moist heat, Taping, Vasopneumatic device, Traction, Ultrasound, Biofeedback, Ionotophoresis 4mg /ml Dexamethasone, Manual therapy, and Re-evaluation.   PLAN FOR NEXT SESSION: Assess HEP/update PRN, continue to progress functional mobility, strengthen core, paraspinals, proximal hip, and lower extremity muscles. Decrease patients pain and help minimize pain with functional movements. Assess response to TPDN.    , PTA 12/25/21 6:17 PM [facility] [address] [place], [place], [place] Phone: [phone]   Fax:  [phone]

## 2021-12-29 NOTE — Progress Notes (Signed)
Office Visit Note   Patient: Yolanda Roberson           Date of Birth: [birth year]           MRN: [id number] Visit Date: 11/20/2021              Requested by: Aliene Beams, MD [id number] [place],  [place] [address] PCP: Aliene Beams, MD   Assessment & Plan: Visit Diagnoses:  1. Radiculopathy, lumbar region     Plan: I reviewed MRI report with patient.  Advised that ultimately may come down her needing surgical invention.  I will have her follow-up with Dr. Ophelia Charter in a few weeks again to review the MRI and discuss further treatment options.  Failed conservative treatment up to this point  Follow-Up Instructions: Return in about 1 week (around 11/27/2021) for with dr yates to discuss treatment options for L5-S1 spondylolisthesis, right LE radiculopathy.   Orders:  Orders Placed This Encounter  Procedures   XR Lumbar Spine Complete   No orders of the defined types were placed in this encounter.     Procedures: No procedures performed   Clinical Data: No additional findings.   Subjective: Chief Complaint  Patient presents with   Lower Back - Pain    HPI 61 year old female comes in with complaints of low back pain and right lower extremity radiculopathy.  Problem ongoing about a year and a half.  She has been treated at [facility].  Has had 2 lumbar ESI's.  Previous lumbar spine MRI from May 06, 2021 showed:  CLINICAL DATA:  Right leg pain, weakness, and numbness   EXAM: MRI LUMBAR SPINE WITHOUT CONTRAST   TECHNIQUE: Multiplanar, multisequence MR imaging of the lumbar spine was performed. No intravenous contrast was administered.   COMPARISON:  06/01/2007 MRI, correlation is also made with 04/05/2021 lumbar spine radiographs   FINDINGS: Segmentation:  Standard.   Alignment: 4 mm anterolisthesis L5 on S1, which is new from the prior MRI. Mild dextrocurvature.   Vertebrae:  No acute fracture or suspicious osseous lesion.   Conus  medullaris and cauda equina: Conus extends to the L1-L2 level. Conus and cauda equina appear normal.   Paraspinal and other soft tissues: Small renal cysts.   Disc levels:   T12-L1: No significant disc bulge. No spinal canal stenosis or neural foraminal narrowing.   L1-L2: No significant disc bulge. No spinal canal stenosis or neural foraminal narrowing.   L2-L3: Mild disc desiccation. No significant disc bulge. Mild facet arthropathy. No spinal canal stenosis or neural foraminal narrowing.   L3-L4: Mild disc desiccation. No significant disc bulge. Mild facet arthropathy. No spinal canal stenosis or neural foraminal narrowing.   L4-L5: No significant disc bulge. Mild-to-moderate facet arthropathy. No spinal canal stenosis or neural foraminal narrowing.   L5-S1: Disc desiccation, anterolisthesis with disc unroofing, and moderate disc bulge. Severe facet arthropathy, which is new from the prior exam, with a posteriorly directed synovial cyst extending from the left facets; widening of the facets. Narrowing of the lateral recesses. No spinal canal stenosis. Mild left and moderate right neural foraminal narrowing.   IMPRESSION: 1. L5-S1 moderate right and mild left neural foraminal narrowing. Narrowing of the lateral recesses at this level could also affect the descending S1 nerve roots. In addition, there is severe facet arthropathy, which can independently be a cause of pain, with widening of the facets, which can indicate instability. These findings are all new compared to the prior exam. 2. No spinal canal  stenosis.     Electronically Signed   By: Merilyn Baba M.D.   On: 05/08/2021 03:35   Review of Systems   Objective: Vital Signs: There were no vitals taken for this visit.  Physical Exam Constitutional:      Appearance: Normal appearance.  HENT:     Head: Normocephalic and atraumatic.     Nose: Nose normal.  Pulmonary:     Effort: No respiratory distress.   Musculoskeletal:     Comments: Gait is somewhat antalgic.  Positive lumbar paraspinal tenderness/spasm.  Positive right sciatic notch tenderness.  Tenderness over the right hip greater trochanter bursa.  Positive right straight leg raise.  No focal motor deficits.  Negative logroll bilateral hips.  Neurological:     Mental Status: She is alert.     Ortho Exam  Specialty Comments:  No specialty comments available.  Imaging: No results found.   PMFS History: Patient Active Problem List   Diagnosis Date Noted   Spondylolisthesis, lumbar region 12/13/2021   No past medical history on file.  No family history on file.  No past surgical history on file. Social History   Occupational History   Not on file  Tobacco Use   Smoking status: Never   Smokeless tobacco: Not on file  Substance and Sexual Activity   Alcohol use: No   Drug use: No   Sexual activity: Not on file

## 2022-01-01 ENCOUNTER — Ambulatory Visit (HOSPITAL_BASED_OUTPATIENT_CLINIC_OR_DEPARTMENT_OTHER): Payer: BC Managed Care – PPO | Admitting: Physical Therapy

## 2022-01-01 ENCOUNTER — Encounter (HOSPITAL_BASED_OUTPATIENT_CLINIC_OR_DEPARTMENT_OTHER): Payer: Self-pay | Admitting: Physical Therapy

## 2022-01-01 DIAGNOSIS — M79671 Pain in right foot: Secondary | ICD-10-CM

## 2022-01-01 DIAGNOSIS — M25551 Pain in right hip: Secondary | ICD-10-CM

## 2022-01-01 DIAGNOSIS — M5459 Other low back pain: Secondary | ICD-10-CM

## 2022-01-01 DIAGNOSIS — M79651 Pain in right thigh: Secondary | ICD-10-CM

## 2022-01-01 DIAGNOSIS — M6281 Muscle weakness (generalized): Secondary | ICD-10-CM | POA: Diagnosis not present

## 2022-01-01 NOTE — Therapy (Signed)
OUTPATIENT PHYSICAL THERAPY TREATMENT NOTE   Patient Name: Yolanda Roberson MRN: [id number] DOB:1960-12-02, 61 y.o., female Today's Date: 01/01/2022  PCP:  Caren Macadam, MD  REFERRING PROVIDER: Almedia Balls, NP  END OF SESSION:   PT End of Session - 01/01/22 1712     Visit Number 7    Number of Visits 16    Date for PT Re-Evaluation 01/28/22    Authorization Type [employer] COMM PPO    PT Start Time 1702    PT Stop Time 1742    PT Time Calculation (min) 40 min    Behavior During Therapy [facility] for tasks assessed/performed              History reviewed. No pertinent past medical history. History reviewed. No pertinent surgical history. Patient Active Problem List   Diagnosis Date Noted   Spondylolisthesis, lumbar region 12/13/2021    REFERRING DIAG: Spondylolisthesis, lumbar region [M43.16], Radiculopathy, lumbar region [M54.16], Other specified joint disorders, right hip [M25.851], Pain in right leg [M79.604]  THERAPY DIAG:  Other low back pain  Pain in right hip  Pain in right thigh  Pain in right foot  Rationale for Evaluation and Treatment Rehabilitation   PERTINENT HISTORY: None  PRECAUTIONS: None  ONSET DATE: a year ago   SUBJECTIVE:                                                                                                                                                                                            SUBJECTIVE STATEMENT: Pt reports she thinks she is getting better.  She reports she has tried to work on bending her knees when reaching down.   Eval:  Pt states that she had an insidious onset of R dorsum foot pain that started about a year ago. She states that it has progressively gotten worse resulting in lateral pain from her foot to her R hip and into her lower back Numbness and tingling present only at the dorsum of her foot. She reports remainder of lateral pain being sharp.    PAIN:  Are you having pain? Yes: NPRS scale: 6/10   Pain  location: R lateral leg, dorsum of foot.  Pain description: Sharp pain in lateral R leg, N/T in dorsum of foot. Pulling in HS Aggravating factors: Any movement.  Relieving factors: Nothing  OBJECTIVE:    DIAGNOSTIC FINDINGS:  X- ray done of lumbar spine. No interpretation present yet.    PATIENT SURVEYS:  FOTO 41%, predicted 47% in 12 visits.    SCREENING FOR RED FLAGS: Bowel or bladder incontinence: No Spinal tumors: No Cauda equina syndrome: No Compression fracture:  No Abdominal aneurysm: No   COGNITION:           Overall cognitive status: Within functional limits for tasks assessed                          SENSATION: WFL     POSTURE: No Significant postural limitations   PALPATION: Pt tender to palpation along entire R lateral LE.  Tenderness in lateral lumbar paraspinals.    LUMBAR ROM:    Active  A/PROM  eval  Flexion WFL  Extension 10  Right lateral flexion To knee joint line Pain in R side  Left lateral flexion To knee joint line Pain in R side  Right rotation WFL   Left rotation WFL pulling on R side.    (Blank rows = not tested)   LOWER EXTREMITY ROM:      Active  Right eval Left eval  Hip flexion [facility]  Hip extension      Hip abduction      Hip adduction      Hip internal rotation PROM 40 PROM 25  Hip external rotation PROM 2 PROM 15  Knee flexion [facility] WFL  Knee extension WFL WFL   (Blank rows = not tested)   LOWER EXTREMITY MMT:     MMT Right eval Left eval  Hip flexion 3- 3+  Hip extension      Hip abduction      Hip adduction      Hip internal rotation      Hip external rotation      Knee flexion 4- 4+  Knee extension 4- 5   (Blank rows = not tested)   LUMBAR SPECIAL TESTS:  FABER test: Positive and R SCOURS: Negative   FUNCTIONAL TESTS:  None performed due to time and significant pain.    GAIT: Distance walked: Within clinic (36ft)  Assistive device utilized: None Level of assistance: Complete [place] Comments:  Pt with lateral lean to L with decreased step length and stance time.     TODAY'S TREATMENT  9/26: Pt seen for aquatic therapy today.  Treatment took place in water 3.25-4.5 ft in depth at the [facility] pool. Temp of water was 93.  Pt entered/exited the pool via stairs with supervision with bilat rail.   * straddling noodle in deeper water, holding wall:  cycling, cc ski, gentle jumping jack motion  *Holding wall: heel raises; squats; hip ext; hip abdct * holding onto white barbell:  forward walking, backward walking, side stepping 1/2 width (painful to R)  * suspended with noodle under arms behind back, along legs to dangle for spine/hip decompression  * STS (high bench in water with UE on white barbell) with cues for hip hinge, core engaged and forward weight shift x 6 * holding rails :  fig 4 hip stretch x 1 rep each LE (limited range and tolerance on RLE)   Pt requires the buoyancy and hydrostatic pressure of water for support, and to offload joints by unweighting joint load by at least 50 % in navel deep water and by at least 75-80% in chest to neck deep water.  Viscosity of the water is needed for resistance of strengthening. Water current perturbations provides challenge to standing balance requiring increased core activation.   12/24/21 -Manual therapy for skilled palpation and Trigger Point Dry-Needling  Treatment instructions: Expect mild to moderate muscle soreness. Patient Consent Given: Yes Education handout provided: verbally provided Muscles treated:R  Lateral gastroc-solus, and peroneals Treatment response/outcome: good overall tolerance,twitch response noted   - Graston to R gastroc - adhesions noted in muscle tissue.  - Supine PPT x20 - Pt reports some pain relief in her R hip. -Supine gastroc stretch with strap 3X30 sec -Supine Hamstring stretch with strap3X30 sec -Supine piriformis stretch on Rt 3 X 30 -Seated sciatic/peroneal nerve 3 sec X 10 on R  each -Standing hip abduction X 15 bilat -Standing hip extension X 15 bilat    PATIENT EDUCATION:  Education details: Copywriter, advertising and progression  Person educated: Patient Education method: Medical illustrator Education comprehension: verbalized understanding and returned demonstration     HOME EXERCISE PROGRAM: Access Code: [email or web address] URL: [email or web address] Date: 11/26/2021 Prepared by: Royal Hawthorn   Exercises - Supine Lower Trunk Rotation  - 1 x daily - 7 x weekly - 3 sets - 10 reps  Updated HEP 12/24/2021:  Access Code: [email or web address] URL: [email or web address] Date: 12/24/2021 Prepared by: Royal Hawthorn  Exercises - Supine Lower Trunk Rotation  - 2 x daily - 7 x weekly - 2 sets - 10 reps - Clamshell  - 2 x daily - 7 x weekly - 2 sets - 10 reps - Supine Single Knee to Double Knee to Chest Stretch  - 2 x daily - 7 x weekly - 2 sets - 30 hold - Supine Piriformis Stretch with Foot on Ground  - 2 x daily - 7 x weekly - 2 sets - 30 hold - Supine Peroneal Nerve Glide  - 2 x daily - 7 x weekly - 2 sets - 10 reps - Towel Scrunches  - 2 x daily - 7 x weekly - 2 sets - 10 reps - Seated Marble Pick-Up with Toes  - 1 x daily - 7 x weekly - 1 sets - Supine Quadriceps Stretch with Strap on Table  - 2 x daily - 7 x weekly - 2 sets - 30 hold ASSESSMENT:   CLINICAL IMPRESSION: Pt less guarded with gait in water at chest depth; pt taking direction from therapist on deck for first time.  Step length more normalized in water compared to last session.  Pt reported reduction of pain while LE suspended in deeper water.  She reported increased pain with R side stepping, but not Rt hip abdct in 4+ ft of water.   Rt hip ER very tight; limited tolerance for seated fig 4 position.  Pain stayed at lower level even after she exited water.  Encouraged pt to continue working on better body mechanics for reaching items that are lower, as well as passing medicine out at  work (mini squat vs only bending from back).  Pt will continue to benefit from skilled PT to address continued deficits.    OBJECTIVE IMPAIRMENTS Abnormal gait, decreased activity tolerance, decreased balance, decreased endurance, decreased knowledge of condition, decreased mobility, difficulty walking, decreased ROM, decreased strength, obesity, and pain.    ACTIVITY LIMITATIONS carrying, lifting, bending, sitting, standing, squatting, sleeping, stairs, bed mobility, bathing, dressing, and locomotion level   PARTICIPATION LIMITATIONS: meal prep, cleaning, laundry, driving, community activity, and occupation   PERSONAL FACTORS Education and Profession are also affecting patient's functional outcome.    REHAB POTENTIAL: Good   CLINICAL DECISION MAKING: Evolving/moderate complexity   EVALUATION COMPLEXITY: Moderate     GOALS: Goals reviewed with patient? No   SHORT TERM GOALS: Target date: 12/24/2021   Pt will be I and compliant with initial HEP. Baseline: Goal status: INITIAL   2.  Pt will report </= 3/10 pain at baseline             Baseline:7/10 Goal status: INITIAL   LONG TERM GOALS: Target date: 01/21/2022   Pt will be independent with advanced HEP to continue to address postural limitations and muscle imbalances. Baseline:  Goal status: INITIAL   2.  Pt will report </= 3/10 pain at baseline                        Baseline: Goal status: INITIAL   3.  Pt will improve FOTO function score to no less than 47% as proxy for functional improvement Baseline:  Goal status: INITIAL   4.  Pt will be able to run errands for her family without familiar pain.  Baseline: Unable to do anything beyond work.  Goal status: INITIAL   5.  Pt will improve her MMT's to 4/5 globally.  Baseline: See chart above  Goal status: INITIAL   6.  Pt will ambulate with normal gait pattern.  Baseline:  Goal status: INITIAL     PLAN: PT FREQUENCY: 2x/week   PT DURATION: 8 weeks   PLANNED  INTERVENTIONS: Therapeutic exercises, Therapeutic activity, Neuromuscular re-education, Balance training, Gait training, Patient/Family education, Self Care, Joint mobilization, Joint manipulation, Stair training, Aquatic Therapy, Dry Needling, Electrical stimulation, Spinal manipulation, Spinal mobilization, Cryotherapy, Moist heat, Taping, Vasopneumatic device, Traction, Ultrasound, Biofeedback, Ionotophoresis 4mg /ml Dexamethasone, Manual therapy, and Re-evaluation.   PLAN FOR NEXT SESSION: Assess HEP/update PRN, continue to progress functional mobility, strengthen core, paraspinals, proximal hip, and lower extremity muscles. Decrease patients pain and help minimize pain with functional movements. Assess response to TPDN.    , PTA 01/01/22 6:25 PM [facility] [address] [place], [place], [place] Phone: [phone]   Fax:  [phone]

## 2022-01-02 ENCOUNTER — Encounter: Payer: Self-pay | Admitting: Physical Therapy

## 2022-01-02 ENCOUNTER — Ambulatory Visit (INDEPENDENT_AMBULATORY_CARE_PROVIDER_SITE_OTHER): Payer: BC Managed Care – PPO | Admitting: Physical Therapy

## 2022-01-02 DIAGNOSIS — M79651 Pain in right thigh: Secondary | ICD-10-CM | POA: Diagnosis not present

## 2022-01-02 DIAGNOSIS — M79671 Pain in right foot: Secondary | ICD-10-CM | POA: Diagnosis not present

## 2022-01-02 DIAGNOSIS — M25551 Pain in right hip: Secondary | ICD-10-CM

## 2022-01-02 DIAGNOSIS — M5459 Other low back pain: Secondary | ICD-10-CM

## 2022-01-02 DIAGNOSIS — M6281 Muscle weakness (generalized): Secondary | ICD-10-CM

## 2022-01-02 NOTE — Therapy (Signed)
OUTPATIENT PHYSICAL THERAPY TREATMENT NOTE   Patient Name: Yolanda Roberson MRN: [id number] DOB:January 16, 1961, 61 y.o., female Today's Date: 01/02/2022  PCP:  Aliene Beams, MD  REFERRING PROVIDER: Carilyn Goodpasture, NP  END OF SESSION:   PT End of Session - 01/02/22 1550     Visit Number 8    Number of Visits 16    Date for PT Re-Evaluation 01/28/22    Authorization Type BCBS COMM PPO    PT Start Time 1550    PT Stop Time 1630    PT Time Calculation (min) 40 min    Activity Tolerance Patient tolerated treatment well    Behavior During Therapy [facility] for tasks assessed/performed              History reviewed. No pertinent past medical history. History reviewed. No pertinent surgical history. Patient Active Problem List   Diagnosis Date Noted   Spondylolisthesis, lumbar region 12/13/2021    REFERRING DIAG: Spondylolisthesis, lumbar region [M43.16], Radiculopathy, lumbar region [M54.16], Other specified joint disorders, right hip [M25.851], Pain in right leg [M79.604]  THERAPY DIAG:  Other low back pain  Pain in right hip  Pain in right thigh  Pain in right foot  Muscle weakness (generalized)  Rationale for Evaluation and Treatment Rehabilitation   PERTINENT HISTORY: None  PRECAUTIONS: None  ONSET DATE: a year ago   SUBJECTIVE:                                                                                                                                                                                            SUBJECTIVE STATEMENT: Pt reports the pain is more in the Rt lateral leg today and not as much radiating down into her calf Eval:  Pt states that she had an insidious onset of R dorsum foot pain that started about a year ago. She states that it has progressively gotten worse resulting in lateral pain from her foot to her R hip and into her lower back Numbness and tingling present only at the dorsum of her foot. She reports remainder of lateral pain being  sharp.    PAIN:  Are you having pain? Yes: NPRS scale: 6/10   Pain location: R lateral leg Pain description: Sharp pain in lateral R leg, N/T in dorsum of foot. Pulling in HS Aggravating factors: Any movement.  Relieving factors: Nothing  OBJECTIVE:    DIAGNOSTIC FINDINGS:  X- ray done of lumbar spine. No interpretation present yet.    PATIENT SURVEYS:  FOTO 41%, predicted 47% in 12 visits.    SCREENING FOR RED FLAGS: Bowel or bladder incontinence: No Spinal tumors: No Cauda  equina syndrome: No Compression fracture: No Abdominal aneurysm: No   COGNITION:           Overall cognitive status: Within functional limits for tasks assessed                          SENSATION: WFL     POSTURE: No Significant postural limitations   PALPATION: Pt tender to palpation along entire R lateral LE.  Tenderness in lateral lumbar paraspinals.    LUMBAR ROM:    Active  A/PROM  eval  Flexion WFL  Extension 10  Right lateral flexion To knee joint line Pain in R side  Left lateral flexion To knee joint line Pain in R side  Right rotation WFL   Left rotation WFL pulling on R side.    (Blank rows = not tested)   LOWER EXTREMITY ROM:      Active  Right eval Left eval  Hip flexion [facility]  Hip extension      Hip abduction      Hip adduction      Hip internal rotation PROM 40 PROM 25  Hip external rotation PROM 2 PROM 15  Knee flexion [facility] WFL  Knee extension WFL WFL   (Blank rows = not tested)   LOWER EXTREMITY MMT:     MMT Right eval Left eval  Hip flexion 3- 3+  Hip extension      Hip abduction      Hip adduction      Hip internal rotation      Hip external rotation      Knee flexion 4- 4+  Knee extension 4- 5   (Blank rows = not tested)   LUMBAR SPECIAL TESTS:  FABER test: Positive and R SCOURS: Negative   FUNCTIONAL TESTS:  None performed due to time and significant pain.    GAIT: Distance walked: Within clinic (6ft)  Assistive device utilized:  None Level of assistance: Complete Independence Comments: Pt with lateral lean to L with decreased step length and stance time.     TODAY'S TREATMENT  01/02/22 -Manual therapy for skilled palpation and Trigger Point Dry-Needling  Treatment instructions: Expect mild to moderate muscle soreness. Patient Consent Given: Yes Education handout provided: verbally provided Muscles treated:Rt TFL,glutes,piriformis Treatment response/outcome: good overall tolerance,twitch response noted  Therex - Supine bridges 5 sec X15 -Supine piriformis stretch on Rt 3 X 30 -Seated sciatic/peroneal nerve 3 sec X 10 on R each -Standing hip abd X 15 bilat -Standing hip ext X 15 bilat -Standing hip hike X 10 bilat -Beginner Deadlift 5# from waist to 6 inch box X 10 with cues and demo for proper body mechanics -Standing gastroc stretch on Rt 3X30 sec   9/26: Pt seen for aquatic therapy today.  Treatment took place in water 3.25-4.5 ft in depth at the [facility] pool. Temp of water was 93.  Pt entered/exited the pool via stairs with supervision with bilat rail.   * straddling noodle in deeper water, holding wall:  cycling, cc ski, gentle jumping jack motion  *Holding wall: heel raises; squats; hip ext; hip abdct * holding onto white barbell:  forward walking, backward walking, side stepping 1/2 width (painful to R)  * suspended with noodle under arms behind back, along legs to dangle for spine/hip decompression  * STS (high bench in water with UE on white barbell) with cues for hip hinge, core engaged and forward weight shift x 6 *  holding rails :  fig 4 hip stretch x 1 rep each LE (limited range and tolerance on RLE)   Pt requires the buoyancy and hydrostatic pressure of water for support, and to offload joints by unweighting joint load by at least 50 % in navel deep water and by at least 75-80% in chest to neck deep water.  Viscosity of the water is needed for resistance of strengthening. Water  current perturbations provides challenge to standing balance requiring increased core activation.   12/24/21 -Manual therapy for skilled palpation and Trigger Point Dry-Needling  Treatment instructions: Expect mild to moderate muscle soreness. Patient Consent Given: Yes Education handout provided: verbally provided Muscles treated:R Lateral gastroc-solus, and peroneals Treatment response/outcome: good overall tolerance,twitch response noted   - [place] to R gastroc - adhesions noted in muscle tissue.  - Supine PPT x20 - Pt reports some pain relief in her R hip. -Supine gastroc stretch with strap 3X30 sec -Supine Hamstring stretch with strap3X30 sec -Supine piriformis stretch on Rt 3 X 30 -Seated sciatic/peroneal nerve 3 sec X 10 on R each -Standing hip abduction X 15 bilat -Standing hip extension X 15 bilat    PATIENT EDUCATION:  Education details: Geographical information systems officer and progression  Person educated: Patient Education method: Customer service manager Education comprehension: verbalized understanding and returned demonstration     HOME EXERCISE PROGRAM: Access Code: [email or web address] URL: [email or web address] Date: 11/26/2021 Prepared by: Rudi Heap   Exercises - Supine Lower Trunk Rotation  - 1 x daily - 7 x weekly - 3 sets - 10 reps  Updated HEP 12/24/2021:  Access Code: [email or web address] URL: [email or web address] Date: 12/24/2021 Prepared by: Rudi Heap  Exercises - Supine Lower Trunk Rotation  - 2 x daily - 7 x weekly - 2 sets - 10 reps - Clamshell  - 2 x daily - 7 x weekly - 2 sets - 10 reps - Supine Single Knee to Double Knee to Chest Stretch  - 2 x daily - 7 x weekly - 2 sets - 30 hold - Supine Piriformis Stretch with Foot on Ground  - 2 x daily - 7 x weekly - 2 sets - 30 hold - Supine Peroneal Nerve Glide  - 2 x daily - 7 x weekly - 2 sets - 10 reps - Towel Scrunches  - 2 x daily - 7 x weekly - 2 sets - 10 reps - Seated Marble Pick-Up with  Toes  - 1 x daily - 7 x weekly - 1 sets - Supine Quadriceps Stretch with Strap on Table  - 2 x daily - 7 x weekly - 2 sets - 30 hold ASSESSMENT:   CLINICAL IMPRESSION: Pain appeared to be more centralized today and not so much below her knee, I did DN her Rt lateral hip area at her request and this appeared to give her some relief. We then worked on stretching and overall strengthening with emphasis on body mechanics with functional lifting.    OBJECTIVE IMPAIRMENTS Abnormal gait, decreased activity tolerance, decreased balance, decreased endurance, decreased knowledge of condition, decreased mobility, difficulty walking, decreased ROM, decreased strength, obesity, and pain.    ACTIVITY LIMITATIONS carrying, lifting, bending, sitting, standing, squatting, sleeping, stairs, bed mobility, bathing, dressing, and locomotion level   PARTICIPATION LIMITATIONS: meal prep, cleaning, laundry, driving, community activity, and occupation   PERSONAL FACTORS Education and Profession are also affecting patient's functional outcome.    REHAB POTENTIAL: Good   CLINICAL DECISION MAKING: Evolving/moderate complexity   EVALUATION COMPLEXITY: Moderate  GOALS: Goals reviewed with patient? No   SHORT TERM GOALS: Target date: 12/24/2021   Pt will be I and compliant with initial HEP. Baseline: Goal status: INITIAL   2.  Pt will report </= 3/10 pain at baseline             Baseline:7/10 Goal status: INITIAL   LONG TERM GOALS: Target date: 01/21/2022   Pt will be independent with advanced HEP to continue to address postural limitations and muscle imbalances. Baseline:  Goal status: INITIAL   2.  Pt will report </= 3/10 pain at baseline                        Baseline: Goal status: INITIAL   3.  Pt will improve FOTO function score to no less than 47% as proxy for functional improvement Baseline:  Goal status: INITIAL   4.  Pt will be able to run errands for her family without familiar pain.   Baseline: Unable to do anything beyond work.  Goal status: INITIAL   5.  Pt will improve her MMT's to 4/5 globally.  Baseline: See chart above  Goal status: INITIAL   6.  Pt will ambulate with normal gait pattern.  Baseline:  Goal status: INITIAL     PLAN: PT FREQUENCY: 2x/week   PT DURATION: 8 weeks   PLANNED INTERVENTIONS: Therapeutic exercises, Therapeutic activity, Neuromuscular re-education, Balance training, Gait training, Patient/Family education, Self Care, Joint mobilization, Joint manipulation, Stair training, Aquatic Therapy, Dry Needling, Electrical stimulation, Spinal manipulation, Spinal mobilization, Cryotherapy, Moist heat, Taping, Vasopneumatic device, Traction, Ultrasound, Biofeedback, Ionotophoresis 4mg /ml Dexamethasone, Manual therapy, and Re-evaluation.   PLAN FOR NEXT SESSION: encouraged her to set up more land PT as this was the last one she had scheduled and she agrees she feels she needs to continue with one land and one water PT per week.    , PT, DPT 01/02/22 3:51 PM

## 2022-01-03 ENCOUNTER — Encounter: Payer: BC Managed Care – PPO | Admitting: Rehabilitative and Restorative Service Providers"

## 2022-01-08 ENCOUNTER — Encounter (HOSPITAL_BASED_OUTPATIENT_CLINIC_OR_DEPARTMENT_OTHER): Payer: Self-pay | Admitting: Physical Therapy

## 2022-01-08 ENCOUNTER — Ambulatory Visit (HOSPITAL_BASED_OUTPATIENT_CLINIC_OR_DEPARTMENT_OTHER): Payer: BC Managed Care – PPO | Attending: Family Medicine | Admitting: Physical Therapy

## 2022-01-08 DIAGNOSIS — M79671 Pain in right foot: Secondary | ICD-10-CM | POA: Insufficient documentation

## 2022-01-08 DIAGNOSIS — M5459 Other low back pain: Secondary | ICD-10-CM | POA: Diagnosis not present

## 2022-01-08 DIAGNOSIS — R2689 Other abnormalities of gait and mobility: Secondary | ICD-10-CM | POA: Diagnosis not present

## 2022-01-08 DIAGNOSIS — M6281 Muscle weakness (generalized): Secondary | ICD-10-CM | POA: Insufficient documentation

## 2022-01-08 DIAGNOSIS — M79651 Pain in right thigh: Secondary | ICD-10-CM | POA: Insufficient documentation

## 2022-01-08 DIAGNOSIS — M25551 Pain in right hip: Secondary | ICD-10-CM | POA: Insufficient documentation

## 2022-01-08 NOTE — Therapy (Signed)
OUTPATIENT PHYSICAL THERAPY TREATMENT NOTE   Patient Name: Yolanda Roberson MRN: [id number] DOB:06-07-1960, 61 y.o., female Today's Date: 01/08/2022  PCP:  Aliene Beams, MD  REFERRING PROVIDER: Carilyn Goodpasture, NP  END OF SESSION:   PT End of Session - 01/08/22 1707     Visit Number 9    Number of Visits 16    Date for PT Re-Evaluation 01/28/22    Authorization Type BCBS COMM PPO    PT Start Time 1700    PT Stop Time 1740    PT Time Calculation (min) 40 min    Activity Tolerance Patient tolerated treatment well    Behavior During Therapy [facility] for tasks assessed/performed              History reviewed. No pertinent past medical history. History reviewed. No pertinent surgical history. Patient Active Problem List   Diagnosis Date Noted   Spondylolisthesis, lumbar region 12/13/2021    REFERRING DIAG: Spondylolisthesis, lumbar region [M43.16], Radiculopathy, lumbar region [M54.16], Other specified joint disorders, right hip [M25.851], Pain in right leg [M79.604]  THERAPY DIAG:  Other low back pain  Pain in right hip  Pain in right thigh  Pain in right foot  Muscle weakness (generalized)  Rationale for Evaluation and Treatment Rehabilitation   PERTINENT HISTORY: None  PRECAUTIONS: None  ONSET DATE: a year ago   SUBJECTIVE:                                                                                                                                                                                            SUBJECTIVE STATEMENT: Pt reports she has had more pain in RLE. She feels therapy is helping, but reports the symptoms return to high level with her job.  She states she would like to avoid surgery.   Eval:  Pt states that she had an insidious onset of R dorsum foot pain that started about a year ago. She states that it has progressively gotten worse resulting in lateral pain from her foot to her R hip and into her lower back Numbness and tingling present  only at the dorsum of her foot. She reports remainder of lateral pain being sharp.    PAIN:  Are you having pain? Yes: NPRS scale: 7/10   Pain location: back, radiating down R lateral leg Pain description: Sharp pain in lateral R leg, N/T in dorsum of foot. Pulling in HS Aggravating factors: Any movement.  Relieving factors: Nothing  OBJECTIVE:    DIAGNOSTIC FINDINGS:  X- ray done of lumbar spine. No interpretation present yet.    PATIENT SURVEYS:  FOTO 41%, predicted 47% in  12 visits.    SCREENING FOR RED FLAGS: Bowel or bladder incontinence: No Spinal tumors: No Cauda equina syndrome: No Compression fracture: No Abdominal aneurysm: No   COGNITION:           Overall cognitive status: Within functional limits for tasks assessed                          SENSATION: WFL     POSTURE: No Significant postural limitations   PALPATION: Pt tender to palpation along entire R lateral LE.  Tenderness in lateral lumbar paraspinals.    LUMBAR ROM:    Active  A/PROM  eval  Flexion WFL  Extension 10  Right lateral flexion To knee joint line Pain in R side  Left lateral flexion To knee joint line Pain in R side  Right rotation WFL   Left rotation WFL pulling on R side.    (Blank rows = not tested)   LOWER EXTREMITY ROM:      Active  Right eval Left eval  Hip flexion [facility]  Hip extension      Hip abduction      Hip adduction      Hip internal rotation PROM 40 PROM 25  Hip external rotation PROM 2 PROM 15  Knee flexion [facility] WFL  Knee extension WFL WFL   (Blank rows = not tested)   LOWER EXTREMITY MMT:     MMT Right eval Left eval  Hip flexion 3- 3+  Hip extension      Hip abduction      Hip adduction      Hip internal rotation      Hip external rotation      Knee flexion 4- 4+  Knee extension 4- 5   (Blank rows = not tested)   LUMBAR SPECIAL TESTS:  FABER test: Positive and R SCOURS: Negative   FUNCTIONAL TESTS:  None performed due to time and  significant pain.    GAIT: Distance walked: Within clinic (69ft)  Assistive device utilized: None Level of assistance: Complete Independence Comments: Pt with lateral lean to L with decreased step length and stance time.     TODAY'S TREATMENT  10/3: Pt seen for aquatic therapy today.  Treatment took place in water 3.25-4.5 ft in depth at the [facility] pool. Temp of water was 93.  Pt entered/exited the pool via stairs with supervision with bilat rail.   * straddling noodle in deeper water, holding wall:   * holding onto white barbell:  forward walking, backward walking,  * at wall: Rt hip flexor/ calf stretch x 3 reps, 30 sec hold; side stepping (increased pain) * returned to walking forward/ backward with barbell  * suspended with noodle under arms behind back, allowing legs to dangle for spine/hip decompression   Return to cycling, cc ski, gentle jumping jack motion  * STS with feet on blue step and with UE on yellow noodle- with cues for hip hinge, core engaged and forward weight shift x 8 * holding rails :  fig 4 hip stretch x 1 rep each LE (limited range and tolerance on RLE); R/L hamstring stretch   Pt requires the buoyancy and hydrostatic pressure of water for support, and to offload joints by unweighting joint load by at least 50 % in navel deep water and by at least 75-80% in chest to neck deep water.  Viscosity of the water is needed for resistance of strengthening. Water  current perturbations provides challenge to standing balance requiring increased core activation.   01/02/22 -Manual therapy for skilled palpation and Trigger Point Dry-Needling  Treatment instructions: Expect mild to moderate muscle soreness. Patient Consent Given: Yes Education handout provided: verbally provided Muscles treated:Rt TFL,glutes,piriformis Treatment response/outcome: good overall tolerance,twitch response noted  Therex - Supine bridges 5 sec X15 -Supine piriformis stretch on Rt  3 X 30 -Seated sciatic/peroneal nerve 3 sec X 10 on R each -Standing hip abd X 15 bilat -Standing hip ext X 15 bilat -Standing hip hike X 10 bilat -Beginner Deadlift 5# from waist to 6 inch box X 10 with cues and demo for proper body mechanics -Standing gastroc stretch on Rt 3X30 sec     PATIENT EDUCATION:  Education details: Geographical information systems officer and progression  Person educated: Patient Education method: Customer service manager Education comprehension: verbalized understanding and returned demonstration     HOME EXERCISE PROGRAM: Access Code: [email or web address] URL: [email or web address] Date: 11/26/2021 Prepared by: Rudi Heap   Exercises - Supine Lower Trunk Rotation  - 1 x daily - 7 x weekly - 3 sets - 10 reps   Updated HEP 12/24/2021:  Access Code: [email or web address] URL: [email or web address] Date: 12/24/2021 Prepared by: Rudi Heap  Exercises - Supine Lower Trunk Rotation  - 2 x daily - 7 x weekly - 2 sets - 10 reps - Clamshell  - 2 x daily - 7 x weekly - 2 sets - 10 reps - Supine Single Knee to Double Knee to Chest Stretch  - 2 x daily - 7 x weekly - 2 sets - 30 hold - Supine Piriformis Stretch with Foot on Ground  - 2 x daily - 7 x weekly - 2 sets - 30 hold - Supine Peroneal Nerve Glide  - 2 x daily - 7 x weekly - 2 sets - 10 reps - Towel Scrunches  - 2 x daily - 7 x weekly - 2 sets - 10 reps - Seated Marble Pick-Up with Toes  - 1 x daily - 7 x weekly - 1 sets - Supine Quadriceps Stretch with Strap on Table  - 2 x daily - 7 x weekly - 2 sets - 30 hold ASSESSMENT:   CLINICAL IMPRESSION: Pt arrived with elevated pain level.  With extended time in deeper water with her legs moving while suspended, pain began to very slowly decrease.  Continued tightness in Rt hip rotators and limited tolerance for hip abdct/ side stepping. Encouraged continued work on Product manager at work and home.  Pt making gradual progress towards goals.    OBJECTIVE  IMPAIRMENTS Abnormal gait, decreased activity tolerance, decreased balance, decreased endurance, decreased knowledge of condition, decreased mobility, difficulty walking, decreased ROM, decreased strength, obesity, and pain.    ACTIVITY LIMITATIONS carrying, lifting, bending, sitting, standing, squatting, sleeping, stairs, bed mobility, bathing, dressing, and locomotion level   PARTICIPATION LIMITATIONS: meal prep, cleaning, laundry, driving, community activity, and occupation   PERSONAL FACTORS Education and Profession are also affecting patient's functional outcome.    REHAB POTENTIAL: Good   CLINICAL DECISION MAKING: Evolving/moderate complexity   EVALUATION COMPLEXITY: Moderate     GOALS: Goals reviewed with patient? No   SHORT TERM GOALS: Target date: 12/24/2021   Pt will be I and compliant with initial HEP. Baseline: Goal status: Achieved    2.  Pt will report </= 3/10 pain at baseline             Baseline:7/10 Goal status:Ongoing    LONG TERM GOALS: Target  date: 01/21/2022   Pt will be independent with advanced HEP to continue to address postural limitations and muscle imbalances. Baseline:  Goal status: INITIAL   2.  Pt will report </= 3/10 pain at baseline                        Baseline: Goal status: INITIAL   3.  Pt will improve FOTO function score to no less than 47% as proxy for functional improvement Baseline:  Goal status: INITIAL   4.  Pt will be able to run errands for her family without familiar pain.  Baseline: Unable to do anything beyond work.  Goal status: INITIAL   5.  Pt will improve her MMT's to 4/5 globally.  Baseline: See chart above  Goal status: INITIAL   6.  Pt will ambulate with normal gait pattern.  Baseline:  Goal status: INITIAL     PLAN: PT FREQUENCY: 2x/week   PT DURATION: 8 weeks   PLANNED INTERVENTIONS: Therapeutic exercises, Therapeutic activity, Neuromuscular re-education, Balance training, Gait training,  Patient/Family education, Self Care, Joint mobilization, Joint manipulation, Stair training, Aquatic Therapy, Dry Needling, Electrical stimulation, Spinal manipulation, Spinal mobilization, Cryotherapy, Moist heat, Taping, Vasopneumatic device, Traction, Ultrasound, Biofeedback, Ionotophoresis 4mg /ml Dexamethasone, Manual therapy, and Re-evaluation.   PLAN FOR NEXT SESSION: Aquatics  , Mayer Camel 01/08/22 6:02 PM [facility] [address] [place], [place], [place] Phone: [phone]   Fax:  [phone]

## 2022-01-09 ENCOUNTER — Encounter: Payer: BC Managed Care – PPO | Admitting: Physical Therapy

## 2022-01-15 ENCOUNTER — Ambulatory Visit (HOSPITAL_BASED_OUTPATIENT_CLINIC_OR_DEPARTMENT_OTHER): Payer: BC Managed Care – PPO | Admitting: Physical Therapy

## 2022-01-15 ENCOUNTER — Encounter (HOSPITAL_BASED_OUTPATIENT_CLINIC_OR_DEPARTMENT_OTHER): Payer: Self-pay | Admitting: Physical Therapy

## 2022-01-15 DIAGNOSIS — M25551 Pain in right hip: Secondary | ICD-10-CM

## 2022-01-15 DIAGNOSIS — M79671 Pain in right foot: Secondary | ICD-10-CM

## 2022-01-15 DIAGNOSIS — M5459 Other low back pain: Secondary | ICD-10-CM | POA: Diagnosis not present

## 2022-01-15 DIAGNOSIS — M6281 Muscle weakness (generalized): Secondary | ICD-10-CM | POA: Diagnosis not present

## 2022-01-15 DIAGNOSIS — R2689 Other abnormalities of gait and mobility: Secondary | ICD-10-CM | POA: Diagnosis not present

## 2022-01-15 DIAGNOSIS — M79651 Pain in right thigh: Secondary | ICD-10-CM | POA: Diagnosis not present

## 2022-01-15 NOTE — Therapy (Signed)
OUTPATIENT PHYSICAL THERAPY TREATMENT NOTE   Patient Name: Yolanda Roberson MRN: [id number] DOB:08-07-1960, 61 y.o., female Today's Date: 01/15/2022  PCP:  Aliene Beams, MD  REFERRING PROVIDER: Carilyn Goodpasture, NP  END OF SESSION:   PT End of Session - 01/15/22 1714     Visit Number 10    Number of Visits 16    Date for PT Re-Evaluation 01/28/22    Authorization Type BCBS COMM PPO    PT Start Time 1702    PT Stop Time 1746    PT Time Calculation (min) 44 min    Activity Tolerance Patient tolerated treatment well    Behavior During Therapy [facility] for tasks assessed/performed              History reviewed. No pertinent past medical history. History reviewed. No pertinent surgical history. Patient Active Problem List   Diagnosis Date Noted   Spondylolisthesis, lumbar region 12/13/2021    REFERRING DIAG: Spondylolisthesis, lumbar region [M43.16], Radiculopathy, lumbar region [M54.16], Other specified joint disorders, right hip [M25.851], Pain in right leg [M79.604]  THERAPY DIAG:  Other low back pain  Pain in right hip  Pain in right thigh  Pain in right foot  Muscle weakness (generalized)  Rationale for Evaluation and Treatment Rehabilitation   PERTINENT HISTORY: None  PRECAUTIONS: None  ONSET DATE: a year ago   SUBJECTIVE:                                                                                                                                                                                            SUBJECTIVE STATEMENT: Pt reports last week was "bad", this week is a little better.  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  She has been trying to do more of her HEP.  "I've been using ice a lot, on hip and back". Pt ordered analgesic [employer] because sample packet provided some relief.   Eval:  Pt states that she had an insidious onset of R dorsum foot pain that started about a  year ago. She states that it has progressively gotten worse resulting in lateral pain from her foot to her R hip and into her lower back Numbness and tingling present only at the dorsum of her foot. She reports remainder of lateral pain being sharp.    PAIN:  Are you having pain? Yes: NPRS scale: 6/10   Pain location: back, radiating down R lateral leg Pain description: Sharp pain in lateral R leg, Pulling in HS Aggravating factors: Any movement.  Relieving factors: Nothing  OBJECTIVE:    DIAGNOSTIC FINDINGS:  X- ray done of lumbar spine. No interpretation present yet.    PATIENT SURVEYS:  FOTO 41%, predicted 47% in 12 visits.    SCREENING FOR RED FLAGS: Bowel or bladder incontinence: No Spinal tumors: No Cauda equina syndrome: No Compression fracture: No Abdominal aneurysm: No   COGNITION:           Overall cognitive status: Within functional limits for tasks assessed                          SENSATION: WFL     POSTURE: No Significant postural limitations   PALPATION: Pt tender to palpation along entire R lateral LE.  Tenderness in lateral lumbar paraspinals.    LUMBAR ROM:    Active  A/PROM  eval  Flexion WFL  Extension 10  Right lateral flexion To knee joint line Pain in R side  Left lateral flexion To knee joint line Pain in R side  Right rotation WFL   Left rotation WFL pulling on R side.    (Blank rows = not tested)   LOWER EXTREMITY ROM:      Active  Right eval Left eval  Hip flexion [facility]  Hip extension      Hip abduction      Hip adduction      Hip internal rotation PROM 40 PROM 25  Hip external rotation PROM 2 PROM 15  Knee flexion [facility] WFL  Knee extension WFL WFL   (Blank rows = not tested)   LOWER EXTREMITY MMT:     MMT Right eval Left eval  Hip flexion 3- 3+  Hip extension      Hip abduction      Hip adduction      Hip internal rotation      Hip external rotation      Knee flexion 4- 4+  Knee extension 4- 5   (Blank rows = not  tested)   LUMBAR SPECIAL TESTS:  FABER test: Positive and R SCOURS: Negative   FUNCTIONAL TESTS:  None performed due to time and significant pain.    GAIT: Distance walked: Within clinic (36ft)  Assistive device utilized: None Level of assistance: Complete Independence Comments: Pt with lateral lean to L with decreased step length and stance time.     TODAY'S TREATMENT  10/10: Pt seen for aquatic therapy today.  Treatment took place in water 3.25-4.5 ft in depth at the [place]. Temp of water was 93.  Pt entered/exited the pool via stairs with supervision with bilat rail.   * holding onto white barbell:  forward walking, backward walking, side stepping (increased pain with L side step)  * at wall: Rt hip flexor/ calf stretch x 3 reps, 30 sec hold * straddling noodle in deeper water, holding wall:  cycling, cc ski, gentle jumping jack motion * at wall:  trunk ext x 4 reps of 5 sec  * at bench:  plank moving into cobra like pose;  plank with gentle hip ext * at bench with feet on blue step - STS holding white barbell, with cues for form x 5 * holding wall:  hip openers;  single leg clam (IR/ER) * return to walking forward/ backward and side stepping with UE on white barbell   * pt shown prone press up as option to try at home; pt verbalized understanding    Pt requires the buoyancy and hydrostatic pressure  of water for support, and to offload joints by unweighting joint load by at least 50 % in navel deep water and by at least 75-80% in chest to neck deep water.  Viscosity of the water is needed for resistance of strengthening. Water current perturbations provides challenge to standing balance requiring increased core activation.   Previous land appt 01/02/22 -Manual therapy for skilled palpation and Trigger Point Dry-Needling  Treatment instructions: Expect mild to moderate muscle soreness. Patient Consent Given: Yes Education handout provided: verbally  provided Muscles treated:Rt TFL,glutes,piriformis Treatment response/outcome: good overall tolerance,twitch response noted  Therex - Supine bridges 5 sec X15 -Supine piriformis stretch on Rt 3 X 30 -Seated sciatic/peroneal nerve 3 sec X 10 on R each -Standing hip abd X 15 bilat -Standing hip ext X 15 bilat -Standing hip hike X 10 bilat -Beginner Deadlift 5# from waist to 6 inch box X 10 with cues and demo for proper body mechanics -Standing gastroc stretch on Rt 3X30 sec     PATIENT EDUCATION:  Education details: Geographical information systems officer and progression  Person educated: Patient Education method: Customer service manager Education comprehension: verbalized understanding and returned demonstration     HOME EXERCISE PROGRAM: Access Code: [email or web address] URL: [email or web address] Date: 11/26/2021 Prepared by: Rudi Heap   Exercises - Supine Lower Trunk Rotation  - 1 x daily - 7 x weekly - 3 sets - 10 reps   Updated HEP 12/24/2021:  Access Code: [email or web address] URL: [email or web address] Date: 12/24/2021 Prepared by: Rudi Heap  Exercises - Supine Lower Trunk Rotation  - 2 x daily - 7 x weekly - 2 sets - 10 reps - Clamshell  - 2 x daily - 7 x weekly - 2 sets - 10 reps - Supine Single Knee to Double Knee to Chest Stretch  - 2 x daily - 7 x weekly - 2 sets - 30 hold - Supine Piriformis Stretch with Foot on Ground  - 2 x daily - 7 x weekly - 2 sets - 30 hold - Supine Peroneal Nerve Glide  - 2 x daily - 7 x weekly - 2 sets - 10 reps - Towel Scrunches  - 2 x daily - 7 x weekly - 2 sets - 10 reps - Seated Marble Pick-Up with Toes  - 1 x daily - 7 x weekly - 1 sets - Supine Quadriceps Stretch with Strap on Table  - 2 x daily - 7 x weekly - 2 sets - 30 hold ASSESSMENT:   CLINICAL IMPRESSION: Pt reported better tolerance for suspended cross country ski vs forward walking with barbell. Side stepping to L continues to be painful in R lateral hip.  Continued  tightness in Rt hip rotators, limited ROM observed during Single leg standing clam.  Pain reduced to 5/10 during session; less symptoms in lateral calf with trunk extension.  Pt making gradual progress towards goals. PT to reassess on land next visit.    OBJECTIVE IMPAIRMENTS Abnormal gait, decreased activity tolerance, decreased balance, decreased endurance, decreased knowledge of condition, decreased mobility, difficulty walking, decreased ROM, decreased strength, obesity, and pain.    ACTIVITY LIMITATIONS carrying, lifting, bending, sitting, standing, squatting, sleeping, stairs, bed mobility, bathing, dressing, and locomotion level   PARTICIPATION LIMITATIONS: meal prep, cleaning, laundry, driving, community activity, and occupation   PERSONAL FACTORS Education and Profession are also affecting patient's functional outcome.    REHAB POTENTIAL: Good   CLINICAL DECISION MAKING: Evolving/moderate complexity   EVALUATION COMPLEXITY: Moderate     GOALS: Goals reviewed with patient?  No   SHORT TERM GOALS: Target date: 12/24/2021   Pt will be I and compliant with initial HEP. Baseline: Goal status: Achieved    2.  Pt will report </= 3/10 pain at baseline             Baseline:7/10 Goal status:Ongoing    LONG TERM GOALS: Target date: 01/21/2022   Pt will be independent with advanced HEP to continue to address postural limitations and muscle imbalances. Baseline:  Goal status: INITIAL   2.  Pt will report </= 3/10 pain at baseline                        Baseline: Goal status: INITIAL   3.  Pt will improve FOTO function score to no less than 47% as proxy for functional improvement Baseline:  Goal status: INITIAL   4.  Pt will be able to run errands for her family without familiar pain.  Baseline: Unable to do anything beyond work.  Goal status: INITIAL   5.  Pt will improve her MMT's to 4/5 globally.  Baseline: See chart above  Goal status: INITIAL   6.  Pt will ambulate  with normal gait pattern.  Baseline:  Goal status: INITIAL     PLAN: PT FREQUENCY: 2x/week   PT DURATION: 8 weeks   PLANNED INTERVENTIONS: Therapeutic exercises, Therapeutic activity, Neuromuscular re-education, Balance training, Gait training, Patient/Family education, Self Care, Joint mobilization, Joint manipulation, Stair training, Aquatic Therapy, Dry Needling, Electrical stimulation, Spinal manipulation, Spinal mobilization, Cryotherapy, Moist heat, Taping, Vasopneumatic device, Traction, Ultrasound, Biofeedback, Ionotophoresis 4mg /ml Dexamethasone, Manual therapy, and Re-evaluation.   PLAN FOR NEXT SESSION:  End of POC - assess goals.    Kerin Perna, PTA 01/15/22 6:06 PM [facility] [address] [place], [place], [address] Phone: [phone]   Fax:  [phone]

## 2022-01-17 ENCOUNTER — Encounter: Payer: BC Managed Care – PPO | Admitting: Physical Therapy

## 2022-01-21 ENCOUNTER — Encounter (HOSPITAL_BASED_OUTPATIENT_CLINIC_OR_DEPARTMENT_OTHER): Payer: Self-pay | Admitting: Physical Therapy

## 2022-01-21 ENCOUNTER — Ambulatory Visit (HOSPITAL_BASED_OUTPATIENT_CLINIC_OR_DEPARTMENT_OTHER): Payer: BC Managed Care – PPO | Admitting: Physical Therapy

## 2022-01-21 DIAGNOSIS — M25551 Pain in right hip: Secondary | ICD-10-CM

## 2022-01-21 DIAGNOSIS — M6281 Muscle weakness (generalized): Secondary | ICD-10-CM

## 2022-01-21 DIAGNOSIS — R2689 Other abnormalities of gait and mobility: Secondary | ICD-10-CM | POA: Diagnosis not present

## 2022-01-21 DIAGNOSIS — M5459 Other low back pain: Secondary | ICD-10-CM | POA: Diagnosis not present

## 2022-01-21 DIAGNOSIS — M79671 Pain in right foot: Secondary | ICD-10-CM | POA: Diagnosis not present

## 2022-01-21 DIAGNOSIS — M79651 Pain in right thigh: Secondary | ICD-10-CM | POA: Diagnosis not present

## 2022-01-21 NOTE — Therapy (Signed)
OUTPATIENT PHYSICAL THERAPY TREATMENT NOTE   Patient Name: Yolanda Roberson MRN: [id number] DOB:1961-02-25, 61 y.o., female Today's Date: 01/21/2022  PCP:  Caren Macadam, MD  REFERRING PROVIDER: Almedia Balls, NP  END OF SESSION:   PT End of Session - 01/21/22 1441     Visit Number 11    Number of Visits 21    Date for PT Re-Evaluation 04/21/22    Authorization Type BCBS COMM PPO    PT Start Time 1432    PT Stop Time 1510    PT Time Calculation (min) 38 min    Activity Tolerance Patient tolerated treatment well    Behavior During Therapy WFL for tasks assessed/performed               History reviewed. No pertinent past medical history. History reviewed. No pertinent surgical history. Patient Active Problem List   Diagnosis Date Noted   Spondylolisthesis, lumbar region 12/13/2021    REFERRING DIAG: Spondylolisthesis, lumbar region [M43.16], Radiculopathy, lumbar region [M54.16], Other specified joint disorders, right hip [M25.851], Pain in right leg [M79.604]  THERAPY DIAG:  Other low back pain - Plan: PT plan of care cert/re-cert  Pain in right hip - Plan: PT plan of care cert/re-cert  Pain in right thigh - Plan: PT plan of care cert/re-cert  Pain in right foot - Plan: PT plan of care cert/re-cert  Muscle weakness (generalized) - Plan: PT plan of care cert/re-cert  Other abnormalities of gait and mobility - Plan: PT plan of care cert/re-cert  Rationale for Evaluation and Treatment Rehabilitation   PERTINENT HISTORY: None  PRECAUTIONS: None  ONSET DATE: a year ago   SUBJECTIVE:                                                                                                                                                                                            SUBJECTIVE STATEMENT:  Pt states the pain is slightly better overall but she still has the intermittent pain that comes down the R leg. It is sharp in nature. 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  Eval:  Pt states that she had an insidious onset of R dorsum foot pain that started about a year ago. She states that it has progressively gotten worse resulting in lateral pain from her foot to her R hip and into her lower back Numbness and tingling present only at the dorsum of her foot. She reports remainder of lateral pain being sharp.    PAIN:  Are you having pain? Yes: NPRS scale:  6/10   Pain location: back, radiating down R lateral leg Pain description: Sharp pain in lateral R leg, Pulling in HS Aggravating factors: Any movement.  Relieving factors: Nothing  OBJECTIVE:    DIAGNOSTIC FINDINGS:  X- ray done of lumbar spine. No interpretation present yet.    PATIENT SURVEYS:  FOTO 41%, predicted 47% in 12 visits.   FOTO 10/16 42 pts    PALPATION: Pt tender to palpation along entire R lateral LE.  Tenderness in lateral lumbar paraspinals.    LUMBAR ROM:    Active  A/PROM  10/16  Flexion WFL  Extension WFL  Right lateral flexion 75% with radiating p!  Left lateral flexion 75 with pulling   Right rotation WFL   Left rotation WFL    (Blank rows = not tested)   LOWER EXTREMITY ROM:      Active  Right eval Left eval  Hip flexion [facility]  Hip extension      Hip abduction      Hip adduction      Hip internal rotation [facility]  Hip external rotation [facility]  Knee flexion [facility] WFL  Knee extension WFL WFL   (Blank rows = not tested)   LOWER EXTREMITY MMT:     MMT Right 10/16 Left eval  Hip flexion 4/5 4/5  Hip extension      Hip abduction      Hip adduction      Hip internal rotation      Hip external rotation      Knee flexion 4/5 4+/5  Knee extension 5/5 5   (Blank rows = not tested)   GAIT: Distance walked: Within clinic (32ft)  Assistive device utilized: None Level of assistance: Complete Independence Comments: Decreased R stance time, exaggerated lumbar  lordosis     TODAY'S TREATMENT   10/16  STM bilat QL and lumbar paraspinals UPA grade III L2-5  Exercises - Supine Lower Trunk Rotation  - 2 x daily - 7 x weekly - 2 sets - 10 reps - Seated Quadratus Lumborum Stretch in Chair  - 2 x daily - 7 x weekly - 1 sets - 3 reps - 30 hold - Neutral Lumbar Spine Curl Up  - 2 x daily - 7 x weekly - 1 sets - 10 reps - 2 hold - Supine Posterior Pelvic Tilt  - 2 x daily - 7 x weekly - 2 sets - 10 reps - 2 hold - Supine Peroneal Nerve Glide  - 2 x daily - 7 x weekly - 2 sets - 10 reps   10/10: Pt seen for aquatic therapy today.  Treatment took place in water 3.25-4.5 ft in depth at the [place]. Temp of water was 93.  Pt entered/exited the pool via stairs with supervision with bilat rail.   * holding onto white barbell:  forward walking, backward walking, side stepping (increased pain with L side step)  * at wall: Rt hip flexor/ calf stretch x 3 reps, 30 sec hold * straddling noodle in deeper water, holding wall:  cycling, cc ski, gentle jumping jack motion * at wall:  trunk ext x 4 reps of 5 sec  * at bench:  plank moving into cobra like pose;  plank with gentle hip ext * at bench with feet on blue step - STS holding white barbell, with cues for form x 5 * holding wall:  hip openers;  single leg clam (IR/ER) * return to walking forward/ backward  and side stepping with UE on white barbell   * pt shown prone press up as option to try at home; pt verbalized understanding    Pt requires the buoyancy and hydrostatic pressure of water for support, and to offload joints by unweighting joint load by at least 50 % in navel deep water and by at least 75-80% in chest to neck deep water.  Viscosity of the water is needed for resistance of strengthening. Water current perturbations provides challenge to standing balance requiring increased core activation.   Previous land appt 01/02/22 -Manual therapy for skilled palpation and Trigger Point  Dry-Needling  Treatment instructions: Expect mild to moderate muscle soreness. Patient Consent Given: Yes Education handout provided: verbally provided Muscles treated:Rt TFL,glutes,piriformis Treatment response/outcome: good overall tolerance,twitch response noted  Therex - Supine bridges 5 sec X15 -Supine piriformis stretch on Rt 3 X 30 -Seated sciatic/peroneal nerve 3 sec X 10 on R each -Standing hip abd X 15 bilat -Standing hip ext X 15 bilat -Standing hip hike X 10 bilat -Beginner Deadlift 5# from waist to 6 inch box X 10 with cues and demo for proper body mechanics -Standing gastroc stretch on Rt 3X30 sec     PATIENT EDUCATION:  Education details: anatomy, exercise progression, muscle firing,  envelope of function, HEP, POC  Person educated: Patient Education method: Customer service manager Education comprehension: verbalized understanding and returned demonstration     HOME EXERCISE PROGRAM: Access Code: [email or web address] URL: [email or web address] Date: 11/26/2021 Prepared by: Rudi Heap   ASSESSMENT:   CLINICAL IMPRESSION: Pt with functional improvement in ROM and LE strength but distal, radiating pain still persists. With HEP, pt requires significant VC for abdominal activation and pelvic dissociation. Pt did have improvement in pain today with STM and manual therapy with lower lumbar stretching into seated flexion. Pt has made progression with functional goals in therapy at this time but is still limited with work related tasks and extended activity due to pain. Plan to continue with land and aquatic visits in order to improve pain. Pt advised of centralization and to focus on HEP at home. HEP tapered today to improve pt compliance. Pt would benefit from continued skilled therapy in order to reach goals and maximize functional lumbopelvic strength and ROM for full return to PLOF.    OBJECTIVE IMPAIRMENTS Abnormal gait, decreased activity tolerance,  decreased balance, decreased endurance, decreased knowledge of condition, decreased mobility, difficulty walking, decreased ROM, decreased strength, obesity, and pain.    ACTIVITY LIMITATIONS carrying, lifting, bending, sitting, standing, squatting, sleeping, stairs, bed mobility, bathing, dressing, and locomotion level   PARTICIPATION LIMITATIONS: meal prep, cleaning, laundry, driving, community activity, and occupation   PERSONAL FACTORS Education and Profession are also affecting patient's functional outcome.    REHAB POTENTIAL: Good   CLINICAL DECISION MAKING: Evolving/moderate complexity   EVALUATION COMPLEXITY: Moderate     GOALS: Goals reviewed with patient? No   SHORT TERM GOALS: Target date: 12/24/2021   Pt will be I and compliant with initial HEP. Baseline: Goal status: Achieved    2.  Pt will report </= 3/10 pain at baseline             Baseline:7/10 Goal status:Ongoing    LONG TERM GOALS: Target date: 04/15/2022    Pt will be independent with advanced HEP to continue to address postural limitations and muscle imbalances. Baseline:  Goal status: ongoing   2.  Pt will report </= 3/10 pain at baseline  Baseline: Goal status: ongoing   3.  Pt will improve FOTO function score to no less than 47% as proxy for functional improvement Baseline:  Goal status: ongoing   4.  Pt will be able to run errands for her family without familiar pain.  Baseline: Unable to do anything beyond work.  Goal status: ongoing   5.  Pt will improve her MMT's to 4/5 globally.  Baseline: See chart above  Goal status: met   6.  Pt will ambulate with normal gait pattern.  Baseline:  Goal status: ongoing     PLAN: PT FREQUENCY: 1-2x/week   PT DURATION: 12 weeks(likely DC in 8 wks)   PLANNED INTERVENTIONS: Therapeutic exercises, Therapeutic activity, Neuromuscular re-education, Balance training, Gait training, Patient/Family education, Self Care, Joint  mobilization, Joint manipulation, Stair training, Aquatic Therapy, Dry Needling, Electrical stimulation, Spinal manipulation, Spinal mobilization, Cryotherapy, Moist heat, Taping, Vasopneumatic device, Traction, Ultrasound, Biofeedback, Ionotophoresis 4mg /ml Dexamethasone, Manual therapy, and Re-evaluation.   PLAN FOR NEXT SESSION:  alternating land and pool; land STM and joint mobs in addition to abdominal bracing activity, revisit post pelvic tilt at next   Daleen Bo PT, DPT 01/21/22 3:18 PM

## 2022-01-22 ENCOUNTER — Encounter: Payer: BC Managed Care – PPO | Admitting: Physical Therapy

## 2022-01-24 ENCOUNTER — Encounter (HOSPITAL_BASED_OUTPATIENT_CLINIC_OR_DEPARTMENT_OTHER): Payer: Self-pay | Admitting: Physical Therapy

## 2022-01-24 ENCOUNTER — Ambulatory Visit (HOSPITAL_BASED_OUTPATIENT_CLINIC_OR_DEPARTMENT_OTHER): Payer: BC Managed Care – PPO | Admitting: Physical Therapy

## 2022-01-24 DIAGNOSIS — M79671 Pain in right foot: Secondary | ICD-10-CM

## 2022-01-24 DIAGNOSIS — M25551 Pain in right hip: Secondary | ICD-10-CM

## 2022-01-24 DIAGNOSIS — M79651 Pain in right thigh: Secondary | ICD-10-CM | POA: Diagnosis not present

## 2022-01-24 DIAGNOSIS — M5459 Other low back pain: Secondary | ICD-10-CM | POA: Diagnosis not present

## 2022-01-24 DIAGNOSIS — R2689 Other abnormalities of gait and mobility: Secondary | ICD-10-CM | POA: Diagnosis not present

## 2022-01-24 DIAGNOSIS — M6281 Muscle weakness (generalized): Secondary | ICD-10-CM | POA: Diagnosis not present

## 2022-01-24 NOTE — Therapy (Signed)
OUTPATIENT PHYSICAL THERAPY TREATMENT NOTE   Patient Name: Yolanda Roberson MRN: [id number] DOB:[birth year], 61 y.o., female Today's Date: 01/24/2022  PCP:  Caren Macadam, MD  REFERRING PROVIDER: Almedia Balls, NP  END OF SESSION:   PT End of Session - 01/24/22 1745     Visit Number 12    Number of Visits 21    Date for PT Re-Evaluation 04/21/22    Authorization Type BCBS COMM PPO    PT Start Time 1779    PT Stop Time 1658    PT Time Calculation (min) 42 min    Activity Tolerance Patient tolerated treatment well    Behavior During Therapy WFL for tasks assessed/performed                History reviewed. No pertinent past medical history. History reviewed. No pertinent surgical history. Patient Active Problem List   Diagnosis Date Noted   Spondylolisthesis, lumbar region 12/13/2021    REFERRING DIAG: Spondylolisthesis, lumbar region [M43.16], Radiculopathy, lumbar region [M54.16], Other specified joint disorders, right hip [M25.851], Pain in right leg [M79.604]  THERAPY DIAG:  Other low back pain  Pain in right hip  Pain in right thigh  Pain in right foot  Rationale for Evaluation and Treatment Rehabilitation   PERTINENT HISTORY: None  PRECAUTIONS: None  ONSET DATE: a year ago   SUBJECTIVE:                                                                                                                                                                                            SUBJECTIVE STATEMENT:  Pt was off of work this week; "pain has been a little better".  She went to the [facility] 2x :walked in pool and did some LE exercises, as well as went into the hot tub.  She reports minor relief during last session on land.    Eval:  Pt states that she had an insidious onset of R dorsum foot pain that started about a year ago. She states that it has progressively gotten worse resulting in lateral pain from her foot to her R hip and into her lower back  Numbness and tingling present only at the dorsum of her foot. She reports remainder of lateral pain being sharp.    PAIN:  Are you having pain? Yes: NPRS scale: 5/10   Pain location: back, radiating down R lateral leg Pain description: Sharp pain in lateral R leg, Pulling in HS Aggravating factors: Any movement.  Relieving factors: Nothing  OBJECTIVE:    DIAGNOSTIC FINDINGS:  X- ray done of lumbar spine. No interpretation present yet.  PATIENT SURVEYS:  FOTO 41%, predicted 47% in 12 visits.   FOTO 10/16 42 pts    PALPATION: Pt tender to palpation along entire R lateral LE.  Tenderness in lateral lumbar paraspinals.    LUMBAR ROM:    Active  A/PROM  10/16  Flexion WFL  Extension WFL  Right lateral flexion 75% with radiating p!  Left lateral flexion 75 with pulling   Right rotation WFL   Left rotation WFL    (Blank rows = not tested)   LOWER EXTREMITY ROM:      Active  Right eval Left eval  Hip flexion [facility]  Hip extension      Hip abduction      Hip adduction      Hip internal rotation [facility]  Hip external rotation [facility] [place] [facility]  Knee flexion [facility] WFL  Knee extension WFL WFL   (Blank rows = not tested)   LOWER EXTREMITY MMT:     MMT Right 10/16 Left eval  Hip flexion 4/5 4/5  Hip extension      Hip abduction      Hip adduction      Hip internal rotation      Hip external rotation      Knee flexion 4/5 4+/5  Knee extension 5/5 5   (Blank rows = not tested)   GAIT: Distance walked: Within clinic (58ft)  Assistive device utilized: None Level of assistance: Complete Independence Comments: Decreased R stance time, exaggerated lumbar lordosis     TODAY'S TREATMENT  10/18: Pt seen for aquatic therapy today.  Treatment took place in water 3.25-4.5 ft in depth at the [place]. Temp of water was 93.  Pt entered/exited the pool via stairs with supervision with bilat rail.   * holding onto yellow hand floats:   forward walking, backward  walking, cues for step length * at wall: hip ext x 10, hp abd/add x 10 (increased radicular symptoms in lateral leg with R stance) * side stepping holding yellow hand floats R/L  * straddling noodle in deeper water, holding wall:  cycling, cc ski, gentle jumping jack motion -  2 rounds  * repeated with aqua jogger belt and two blue noodles x 1 round of circuit * at wall: Rt hip flexor/ calf stretch x 3 reps, 30 sec hold * at wall:  trunk ext x 2 reps of 5 sec  * at bench with feet on blue step - STS holding white barbell, with cues for form x 8 * seated weight shifts/ pelvic rocking ant/post, and side to side *seated piriformis stretch  10/16  STM bilat QL and lumbar paraspinals UPA grade III L2-5  Exercises - Supine Lower Trunk Rotation  - 2 x daily - 7 x weekly - 2 sets - 10 reps - Seated Quadratus Lumborum Stretch in Chair  - 2 x daily - 7 x weekly - 1 sets - 3 reps - 30 hold - Neutral Lumbar Spine Curl Up  - 2 x daily - 7 x weekly - 1 sets - 10 reps - 2 hold - Supine Posterior Pelvic Tilt  - 2 x daily - 7 x weekly - 2 sets - 10 reps - 2 hold - Supine Peroneal Nerve Glide  - 2 x daily - 7 x weekly - 2 sets - 10 reps   10/10: Pt seen for aquatic therapy today.  Treatment took place in water 3.25-4.5 ft in depth at the [facility]. Temp of  water was 93.  Pt entered/exited the pool via stairs with supervision with bilat rail.   * holding onto white barbell:  forward walking, backward walking, side stepping (increased pain with L side step)  * at wall: Rt hip flexor/ calf stretch x 3 reps, 30 sec hold * straddling noodle in deeper water, holding wall:  cycling, cc ski, gentle jumping jack motion - * at wall:  trunk ext x 4 reps of 5 sec  * at bench:  plank moving into cobra like pose;  plank with gentle hip ext * at bench with feet on blue step - STS holding white barbell, with cues for form x 5 * holding wall:  hip openers;  single leg clam (IR/ER) * return to walking  forward/ backward and side stepping with UE on white barbell   * pt shown prone press up as option to try at home; pt verbalized understanding    Pt requires the buoyancy and hydrostatic pressure of water for support, and to offload joints by unweighting joint load by at least 50 % in navel deep water and by at least 75-80% in chest to neck deep water.  Viscosity of the water is needed for resistance of strengthening. Water current perturbations provides challenge to standing balance requiring increased core activation.   Previous land appt 01/02/22 -Manual therapy for skilled palpation and Trigger Point Dry-Needling  Treatment instructions: Expect mild to moderate muscle soreness. Patient Consent Given: Yes Education handout provided: verbally provided Muscles treated:Rt TFL,glutes,piriformis Treatment response/outcome: good overall tolerance,twitch response noted  Therex - Supine bridges 5 sec X15 -Supine piriformis stretch on Rt 3 X 30 -Seated sciatic/peroneal nerve 3 sec X 10 on R each -Standing hip abd X 15 bilat -Standing hip ext X 15 bilat -Standing hip hike X 10 bilat -Beginner Deadlift 5# from waist to 6 inch box X 10 with cues and demo for proper body mechanics -Standing gastroc stretch on Rt 3X30 sec     PATIENT EDUCATION:  Education details:exercise progression, Person educated: Patient Education method: Customer service manager Education comprehension: verbalized understanding and returned demonstration     HOME EXERCISE PROGRAM: Access Code: [email or web address] URL: [email or web address] Date: 11/26/2021 Prepared by: Rudi Heap   ASSESSMENT:   CLINICAL IMPRESSION: Pt with limited change in symptoms while in the water today.  She reports the most relief while LE are suspended in the deeper water.-  pain reduced to 3/10.  Trialed aqua jogger belt and 2 blue noodles as this may be what the [employer] has.     Per PT at reassessment:  Plan to continue with  land and aquatic visits in order to improve pain. Pt advised of centralization and to focus on HEP at home. Pt would benefit from continued skilled therapy in order to reach goals and maximize functional lumbopelvic strength and ROM for full return to PLOF.    OBJECTIVE IMPAIRMENTS Abnormal gait, decreased activity tolerance, decreased balance, decreased endurance, decreased knowledge of condition, decreased mobility, difficulty walking, decreased ROM, decreased strength, obesity, and pain.    ACTIVITY LIMITATIONS carrying, lifting, bending, sitting, standing, squatting, sleeping, stairs, bed mobility, bathing, dressing, and locomotion level   PARTICIPATION LIMITATIONS: meal prep, cleaning, laundry, driving, community activity, and occupation   PERSONAL FACTORS Education and Profession are also affecting patient's functional outcome.    REHAB POTENTIAL: Good   CLINICAL DECISION MAKING: Evolving/moderate complexity   EVALUATION COMPLEXITY: Moderate     GOALS: Goals reviewed with patient? No   SHORT TERM GOALS: Target  date: 12/24/2021   Pt will be I and compliant with initial HEP. Baseline: Goal status: Achieved    2.  Pt will report </= 3/10 pain at baseline             Baseline:7/10 Goal status:Ongoing    LONG TERM GOALS: Target date: 04/18/2022    Pt will be independent with advanced HEP to continue to address postural limitations and muscle imbalances. Baseline:  Goal status: ongoing   2.  Pt will report </= 3/10 pain at baseline                        Baseline: Goal status: ongoing   3.  Pt will improve FOTO function score to no less than 47% as proxy for functional improvement Baseline:  Goal status: ongoing   4.  Pt will be able to run errands for her family without familiar pain.  Baseline: Unable to do anything beyond work.  Goal status: ongoing   5.  Pt will improve her MMT's to 4/5 globally.  Baseline: See chart above  Goal status: met   6.  Pt will  ambulate with normal gait pattern.  Baseline:  Goal status: ongoing     PLAN: PT FREQUENCY: 1-2x/week   PT DURATION: 12 weeks(likely DC in 8 wks)   PLANNED INTERVENTIONS: Therapeutic exercises, Therapeutic activity, Neuromuscular re-education, Balance training, Gait training, Patient/Family education, Self Care, Joint mobilization, Joint manipulation, Stair training, Aquatic Therapy, Dry Needling, Electrical stimulation, Spinal manipulation, Spinal mobilization, Cryotherapy, Moist heat, Taping, Vasopneumatic device, Traction, Ultrasound, Biofeedback, Ionotophoresis 4mg /ml Dexamethasone, Manual therapy, and Re-evaluation.   PLAN FOR NEXT SESSION:  alternating land and pool; land STM and joint mobs in addition to abdominal bracing activity, revisit post pelvic tilt at next   Kerin Perna, PTA 01/24/22 5:49 PM [facility] [address] [place], [place], [address] Phone: [phone]   Fax:  [phone]

## 2022-01-25 DIAGNOSIS — Z124 Encounter for screening for malignant neoplasm of cervix: Secondary | ICD-10-CM | POA: Diagnosis not present

## 2022-01-25 DIAGNOSIS — Z1231 Encounter for screening mammogram for malignant neoplasm of breast: Secondary | ICD-10-CM | POA: Diagnosis not present

## 2022-01-25 DIAGNOSIS — Z01419 Encounter for gynecological examination (general) (routine) without abnormal findings: Secondary | ICD-10-CM | POA: Diagnosis not present

## 2022-01-27 NOTE — Therapy (Signed)
OUTPATIENT PHYSICAL THERAPY TREATMENT NOTE   Patient Name: Yolanda Roberson MRN: [id number] DOB:[birth year], 61 y.o., female Today's Date: 01/29/2022  PCP:  Caren Macadam, MD  REFERRING PROVIDER: Almedia Balls, NP  END OF SESSION:   PT End of Session - 01/29/22 1658     Visit Number 14    Number of Visits 21    Date for PT Re-Evaluation 04/21/22    Authorization Type BCBS COMM PPO    PT Start Time 1700    PT Stop Time 1745    PT Time Calculation (min) 45 min    Activity Tolerance Patient tolerated treatment well    Behavior During Therapy [facility] for tasks assessed/performed                 History reviewed. No pertinent past medical history. History reviewed. No pertinent surgical history. Patient Active Problem List   Diagnosis Date Noted   Spondylolisthesis, lumbar region 12/13/2021    REFERRING DIAG: Spondylolisthesis, lumbar region [M43.16], Radiculopathy, lumbar region [M54.16], Other specified joint disorders, right hip [M25.851], Pain in right leg [M79.604]  THERAPY DIAG:  Other low back pain  Pain in right hip  Pain in right thigh  Pain in right foot  Rationale for Evaluation and Treatment Rehabilitation   PERTINENT HISTORY: None  PRECAUTIONS: None  ONSET DATE: a year ago   SUBJECTIVE:                                                                                                                                                                                            SUBJECTIVE STATEMENT:  Pt reports working last 2 days both 8 hour shifts mostly standing and has an increase in pain particularly pop fossa area.  Eval:  Pt states that she had an insidious onset of R dorsum foot pain that started about a year ago. She states that it has progressively gotten worse resulting in lateral pain from her foot to her R hip and into her lower back Numbness and tingling present only at the dorsum of her foot. She reports remainder of lateral pain being  sharp.    PAIN:  Are you having pain? Yes: NPRS scale: 7/10   Pain location: back, radiating down R lateral leg Pain description: Sharp pain in lateral R leg, Pulling in HS Aggravating factors: Any movement.  Relieving factors: Nothing  OBJECTIVE:    DIAGNOSTIC FINDINGS:  X- ray done of lumbar spine. No interpretation present yet.    PATIENT SURVEYS:  FOTO 41%, predicted 47% in 12 visits.   FOTO 10/16 42 pts    PALPATION: Pt tender to palpation along entire  R lateral LE.  Tenderness in lateral lumbar paraspinals.    LUMBAR ROM:    Active  A/PROM  10/16  Flexion WFL  Extension WFL  Right lateral flexion 75% with radiating p!  Left lateral flexion 75 with pulling   Right rotation WFL   Left rotation WFL    (Blank rows = not tested)   LOWER EXTREMITY ROM:      Active  Right eval Left eval  Hip flexion [facility]  Hip extension      Hip abduction      Hip adduction      Hip internal rotation [facility]  Hip external rotation [facility]  Knee flexion [facility] WFL  Knee extension WFL WFL   (Blank rows = not tested)   LOWER EXTREMITY MMT:     MMT Right 10/16 Left eval  Hip flexion 4/5 4/5  Hip extension      Hip abduction      Hip adduction      Hip internal rotation      Hip external rotation      Knee flexion 4/5 4+/5  Knee extension 5/5 5   (Blank rows = not tested)   GAIT: Distance walked: Within clinic (86f)  Assistive device utilized: None Level of assistance: Complete Independence Comments: Decreased R stance time, exaggerated lumbar lordosis     TODAY'S TREATMENT  10/24: Pt seen for aquatic therapy today.  Treatment [place] 3.25[place]4.[place] was 93.  Pt entered/exited the pool via stairs with supervision with bilat rail.   * holding onto yellow hand floats:   forward walking, backward walking, cues for step length * at wall: hip ext x 10, hp abd/add x 10 (increased radicular symptoms in  lateral leg with R stance),PPT against wall (VC and demo for execution; squats * side stepping holding yellow hand floats R/L  *Exaggerated L stretch feet on 2nd step hand on hand rail 3x30s hold. 4th rep with R/L hip hiking x4 *kick board push pull for core engagement. Cues for abdominal bracing and glue isometric hold * Seated on lift chair submerged:  cycling, add/abd 2x20 * at wall: Rt hip flexor/ calf stretch x 3 reps, 30 sec hold * at wall:  trunk ext x 2 reps of 5 sec     Therex - Supine bridges 5 sec X15 -Supine piriformis stretch on Rt 3 X 30 -Seated sciatic/peroneal nerve 3 sec X 10 on R each -Standing hip abd X 15 bilat -Standing hip ext X 15 bilat -Standing hip hike X 10 bilat -Beginner Deadlift 5# from waist to 6 inch box X 10 with cues and demo for proper body mechanics -Standing gastroc stretch on Rt 3X30 sec     PATIENT EDUCATION:  Education details:exercise progression, Person educated: Patient Education method: ECustomer service managerEducation comprehension: verbalized understanding and returned demonstration     HOME EXERCISE PROGRAM: Access Code: [email or web address] URL: [email or web address] Date: 11/26/2021 Prepared by: SRudi Heap  ASSESSMENT:   CLINICAL IMPRESSION: Increase pain with increased time on feet at work.  Pt edu to better understand her dx. Able to gain good LB stretch (lumbar into flex) which pt reports feels good and reduces radicular sx. She requires mod vc and cga for gaining position then demonstration for improved execution. Abdominal bracing cues given throughout session.Pain although, does continue throughout session. Goals ongoing    OBJECTIVE IMPAIRMENTS Abnormal gait, decreased activity tolerance, decreased  balance, decreased endurance, decreased knowledge of condition, decreased mobility, difficulty walking, decreased ROM, decreased strength, obesity, and pain.    ACTIVITY LIMITATIONS carrying, lifting, bending,  sitting, standing, squatting, sleeping, stairs, bed mobility, bathing, dressing, and locomotion level   PARTICIPATION LIMITATIONS: meal prep, cleaning, laundry, driving, community activity, and occupation   PERSONAL FACTORS Education and Profession are also affecting patient's functional outcome.    REHAB POTENTIAL: Good   CLINICAL DECISION MAKING: Evolving/moderate complexity   EVALUATION COMPLEXITY: Moderate     GOALS: Goals reviewed with patient? No   SHORT TERM GOALS: Target date: 12/24/2021   Pt will be I and compliant with initial HEP. Baseline: Goal status: Achieved    2.  Pt will report </= 3/10 pain at baseline             Baseline:7/10 Goal status:Ongoing    LONG TERM GOALS: Target date: 04/23/2022    Pt will be independent with advanced HEP to continue to address postural limitations and muscle imbalances. Baseline:  Goal status: ongoing   2.  Pt will report </= 3/10 pain at baseline                        Baseline: Goal status: ongoing   3.  Pt will improve FOTO function score to no less than 47% as proxy for functional improvement Baseline:  Goal status: ongoing   4.  Pt will be able to run errands for her family without familiar pain.  Baseline: Unable to do anything beyond work.  Goal status: ongoing   5.  Pt will improve her MMT's to 4/5 globally.  Baseline: See chart above  Goal status: met   6.  Pt will ambulate with normal gait pattern.  Baseline:  Goal status: ongoing     PLAN: PT FREQUENCY: 1-2x/week   PT DURATION: 12 weeks(likely DC in 8 wks)   PLANNED INTERVENTIONS: Therapeutic exercises, Therapeutic activity, Neuromuscular re-education, Balance training, Gait training, Patient/Family education, Self Care, Joint mobilization, Joint manipulation, Stair training, Aquatic Therapy, Dry Needling, Electrical stimulation, Spinal manipulation, Spinal mobilization, Cryotherapy, Moist heat, Taping, Vasopneumatic device, Traction, Ultrasound,  Biofeedback, Ionotophoresis 27m/ml Dexamethasone, Manual therapy, and Re-evaluation.   PLAN FOR NEXT SESSION:  alternating land and pool; land STM and joint mobs in addition to abdominal bracing activity, revisit post pelvic tilt at next   MStanton Kidney(Tharon Aquas Mukhtar Shams MPT 01/29/22 4:59 PM [place] [address]Phone: [phone]  Fax:  [phone]

## 2022-01-28 ENCOUNTER — Ambulatory Visit (HOSPITAL_BASED_OUTPATIENT_CLINIC_OR_DEPARTMENT_OTHER): Payer: BC Managed Care – PPO | Admitting: Physical Therapy

## 2022-01-28 ENCOUNTER — Encounter: Payer: BC Managed Care – PPO | Admitting: Physical Therapy

## 2022-01-28 ENCOUNTER — Encounter (HOSPITAL_BASED_OUTPATIENT_CLINIC_OR_DEPARTMENT_OTHER): Payer: Self-pay | Admitting: Physical Therapy

## 2022-01-28 DIAGNOSIS — M6281 Muscle weakness (generalized): Secondary | ICD-10-CM

## 2022-01-28 DIAGNOSIS — M5459 Other low back pain: Secondary | ICD-10-CM

## 2022-01-28 DIAGNOSIS — M25551 Pain in right hip: Secondary | ICD-10-CM | POA: Diagnosis not present

## 2022-01-28 DIAGNOSIS — M79671 Pain in right foot: Secondary | ICD-10-CM

## 2022-01-28 DIAGNOSIS — M79651 Pain in right thigh: Secondary | ICD-10-CM

## 2022-01-28 DIAGNOSIS — R2689 Other abnormalities of gait and mobility: Secondary | ICD-10-CM | POA: Diagnosis not present

## 2022-01-28 NOTE — Therapy (Signed)
OUTPATIENT PHYSICAL THERAPY TREATMENT NOTE   Patient Name: Yolanda Roberson MRN: [id number] DOB:April 19, 1960, 61 y.o., female Today's Date: 01/28/2022  PCP:  Caren Macadam, MD  REFERRING PROVIDER: Almedia Balls, NP  END OF SESSION:   PT End of Session - 01/28/22 1620     Visit Number 13    Number of Visits 21    Date for PT Re-Evaluation 04/21/22    Authorization Type BCBS COMM PPO    PT Start Time 1600    PT Stop Time 1640    PT Time Calculation (min) 40 min    Activity Tolerance Patient tolerated treatment well    Behavior During Therapy [facility] for tasks assessed/performed                 History reviewed. No pertinent past medical history. History reviewed. No pertinent surgical history. Patient Active Problem List   Diagnosis Date Noted   Spondylolisthesis, lumbar region 12/13/2021    REFERRING DIAG: Spondylolisthesis, lumbar region [M43.16], Radiculopathy, lumbar region [M54.16], Other specified joint disorders, right hip [M25.851], Pain in right leg [M79.604]  THERAPY DIAG:  Other low back pain  Pain in right hip  Pain in right thigh  Pain in right foot  Muscle weakness (generalized)  Other abnormalities of gait and mobility  Rationale for Evaluation and Treatment Rehabilitation   PERTINENT HISTORY: None  PRECAUTIONS: None  ONSET DATE: a year ago   SUBJECTIVE:                                                                                                                                                                                            SUBJECTIVE STATEMENT:  Pt states the R hip and thigh are in pain today. She is unsure of what she did. It feels "like a ball" into her R hip.   Eval:  Pt states that she had an insidious onset of R dorsum foot pain that started about a year ago. She states that it has progressively gotten worse resulting in lateral pain from her foot to her R hip and into her lower back Numbness and tingling present only  at the dorsum of her foot. She reports remainder of lateral pain being sharp.    PAIN:  Are you having pain? Yes: NPRS scale: 7/10   Pain location: back, radiating down R lateral leg Pain description: Sharp pain in lateral R leg, Pulling in HS Aggravating factors: Any movement.  Relieving factors: Nothing  OBJECTIVE:    DIAGNOSTIC FINDINGS:  X- ray done of lumbar spine. No interpretation present yet.    PATIENT SURVEYS:  FOTO 41%, predicted 47% in 12  visits.   FOTO 10/16 42 pts    PALPATION: Pt tender to palpation along entire R lateral LE.  Tenderness in lateral lumbar paraspinals.    LUMBAR ROM:    Active  A/PROM  10/16  Flexion WFL  Extension WFL  Right lateral flexion 75% with radiating p!  Left lateral flexion 75 with pulling   Right rotation WFL   Left rotation WFL    (Blank rows = not tested)   LOWER EXTREMITY ROM:      Active  Right eval Left eval  Hip flexion [facility]  Hip extension      Hip abduction      Hip adduction      Hip internal rotation [facility]  Hip external rotation [facility]  Knee flexion [facility] WFL  Knee extension WFL WFL   (Blank rows = not tested)   LOWER EXTREMITY MMT:     MMT Right 10/16 Left eval  Hip flexion 4/5 4/5  Hip extension      Hip abduction      Hip adduction      Hip internal rotation      Hip external rotation      Knee flexion 4/5 4+/5  Knee extension 5/5 5   (Blank rows = not tested)   GAIT: Distance walked: Within clinic (20ft)  Assistive device utilized: None Level of assistance: Complete Independence Comments: Decreased R stance time, exaggerated lumbar lordosis     TODAY'S TREATMENT  10/23   STM bilat QL and lumbar paraspinals Prone UPA grade III L2-5  Exercises -child's pose 5s 10x -cat/cow (unable to perform correctly, poor lumbopelvic awareness) -seated flexion with ball 3s 10x  10/18: Pt seen for aquatic therapy today.  Treatment took place in water 3.25-4.5 ft in depth at the [place]. Temp of water was 93.  Pt entered/exited the pool via stairs with supervision with bilat rail.   * holding onto yellow hand floats:   forward walking, backward walking, cues for step length * at wall: hip ext x 10, hp abd/add x 10 (increased radicular symptoms in lateral leg with R stance) * side stepping holding yellow hand floats R/L  * straddling noodle in deeper water, holding wall:  cycling, cc ski, gentle jumping jack motion -  2 rounds  * repeated with aqua jogger belt and two blue noodles x 1 round of circuit * at wall: Rt hip flexor/ calf stretch x 3 reps, 30 sec hold * at wall:  trunk ext x 2 reps of 5 sec  * at bench with feet on blue step - STS holding white barbell, with cues for form x 8 * seated weight shifts/ pelvic rocking ant/post, and side to side *seated piriformis stretch  10/16  STM bilat QL and lumbar paraspinals UPA grade III L2-5  Exercises - Supine Lower Trunk Rotation  - 2 x daily - 7 x weekly - 2 sets - 10 reps - Seated Quadratus Lumborum Stretch in Chair  - 2 x daily - 7 x weekly - 1 sets - 3 reps - 30 hold - Neutral Lumbar Spine Curl Up  - 2 x daily - 7 x weekly - 1 sets - 10 reps - 2 hold - Supine Posterior Pelvic Tilt  - 2 x daily - 7 x weekly - 2 sets - 10 reps - 2 hold - Supine Peroneal Nerve Glide  - 2 x daily - 7 x weekly - 2 sets - 10 reps  10/10: Pt seen for aquatic therapy today.  Treatment took place in water 3.25-4.5 ft in depth at the [place]. Temp of water was 93.  Pt entered/exited the pool via stairs with supervision with bilat rail.   * holding onto white barbell:  forward walking, backward walking, side stepping (increased pain with L side step)  * at wall: Rt hip flexor/ calf stretch x 3 reps, 30 sec hold * straddling noodle in deeper water, holding wall:  cycling, cc ski, gentle jumping jack motion - * at wall:  trunk ext x 4 reps of 5 sec  * at bench:  plank moving into cobra like pose;  plank  with gentle hip ext * at bench with feet on blue step - STS holding white barbell, with cues for form x 5 * holding wall:  hip openers;  single leg clam (IR/ER) * return to walking forward/ backward and side stepping with UE on white barbell   * pt shown prone press up as option to try at home; pt verbalized understanding    Pt requires the buoyancy and hydrostatic pressure of water for support, and to offload joints by unweighting joint load by at least 50 % in navel deep water and by at least 75-80% in chest to neck deep water.  Viscosity of the water is needed for resistance of strengthening. Water current perturbations provides challenge to standing balance requiring increased core activation.   Previous land appt 01/02/22 -Manual therapy for skilled palpation and Trigger Point Dry-Needling  Treatment instructions: Expect mild to moderate muscle soreness. Patient Consent Given: Yes Education handout provided: verbally provided Muscles treated:Rt TFL,glutes,piriformis Treatment response/outcome: good overall tolerance,twitch response noted  Therex - Supine bridges 5 sec X15 -Supine piriformis stretch on Rt 3 X 30 -Seated sciatic/peroneal nerve 3 sec X 10 on R each -Standing hip abd X 15 bilat -Standing hip ext X 15 bilat -Standing hip hike X 10 bilat -Beginner Deadlift 5# from waist to 6 inch box X 10 with cues and demo for proper body mechanics -Standing gastroc stretch on Rt 3X30 sec     PATIENT EDUCATION:  Education details:exercise progression, Person educated: Patient Education method: Customer service manager Education comprehension: verbalized understanding and returned demonstration     HOME EXERCISE PROGRAM: Access Code: [email or web address] URL: [email or web address] Date: 11/26/2021 Prepared by: Rudi Heap   ASSESSMENT:   CLINICAL IMPRESSION: Pt with improvement in lateral thigh back pain following manual therapy. Pt session focused on lumbopelvic  mobility and motor control but pt has difficulty with PPT and APT positioning with cat/cow. Pt sits in lumbar lordosis and requires heavy cuing to achieve flexion, neutral, or PPT. Pt without increase in pain during session. Plan to revisit manual at next land session. Consider repeated opening motions. Consider more pelvic awareness exercise next time in the pool setting. Closing pattern on R recreates distal symptoms. Pt would benefit from continued skilled therapy in order to reach goals and maximize functional lumbopelvic strength and ROM for full return to PLOF.    OBJECTIVE IMPAIRMENTS Abnormal gait, decreased activity tolerance, decreased balance, decreased endurance, decreased knowledge of condition, decreased mobility, difficulty walking, decreased ROM, decreased strength, obesity, and pain.    ACTIVITY LIMITATIONS carrying, lifting, bending, sitting, standing, squatting, sleeping, stairs, bed mobility, bathing, dressing, and locomotion level   PARTICIPATION LIMITATIONS: meal prep, cleaning, laundry, driving, community activity, and occupation   PERSONAL FACTORS Education and Profession are also affecting patient's functional outcome.    REHAB POTENTIAL: Good  CLINICAL DECISION MAKING: Evolving/moderate complexity   EVALUATION COMPLEXITY: Moderate     GOALS: Goals reviewed with patient? No   SHORT TERM GOALS: Target date: 12/24/2021   Pt will be I and compliant with initial HEP. Baseline: Goal status: Achieved    2.  Pt will report </= 3/10 pain at baseline             Baseline:7/10 Goal status:Ongoing    LONG TERM GOALS: Target date: 04/22/2022    Pt will be independent with advanced HEP to continue to address postural limitations and muscle imbalances. Baseline:  Goal status: ongoing   2.  Pt will report </= 3/10 pain at baseline                        Baseline: Goal status: ongoing   3.  Pt will improve FOTO function score to no less than 47% as proxy for  functional improvement Baseline:  Goal status: ongoing   4.  Pt will be able to run errands for her family without familiar pain.  Baseline: Unable to do anything beyond work.  Goal status: ongoing   5.  Pt will improve her MMT's to 4/5 globally.  Baseline: See chart above  Goal status: met   6.  Pt will ambulate with normal gait pattern.  Baseline:  Goal status: ongoing     PLAN: PT FREQUENCY: 1-2x/week   PT DURATION: 12 weeks(likely DC in 8 wks)   PLANNED INTERVENTIONS: Therapeutic exercises, Therapeutic activity, Neuromuscular re-education, Balance training, Gait training, Patient/Family education, Self Care, Joint mobilization, Joint manipulation, Stair training, Aquatic Therapy, Dry Needling, Electrical stimulation, Spinal manipulation, Spinal mobilization, Cryotherapy, Moist heat, Taping, Vasopneumatic device, Traction, Ultrasound, Biofeedback, Ionotophoresis 4mg /ml Dexamethasone, Manual therapy, and Re-evaluation.   PLAN FOR NEXT SESSION:  alternating land and pool; land STM and joint mobs in addition to abdominal bracing activity, revisit post pelvic tilt at next   Kerin Perna, PTA 01/28/22 4:47 PM [place] [address] [place], [place], [address] Phone: [phone]   Fax:  [phone]

## 2022-01-29 ENCOUNTER — Encounter (HOSPITAL_BASED_OUTPATIENT_CLINIC_OR_DEPARTMENT_OTHER): Payer: Self-pay | Admitting: Physical Therapy

## 2022-01-29 ENCOUNTER — Ambulatory Visit (HOSPITAL_BASED_OUTPATIENT_CLINIC_OR_DEPARTMENT_OTHER): Payer: BC Managed Care – PPO | Admitting: Physical Therapy

## 2022-01-29 DIAGNOSIS — M5459 Other low back pain: Secondary | ICD-10-CM | POA: Diagnosis not present

## 2022-01-29 DIAGNOSIS — R2689 Other abnormalities of gait and mobility: Secondary | ICD-10-CM | POA: Diagnosis not present

## 2022-01-29 DIAGNOSIS — M25551 Pain in right hip: Secondary | ICD-10-CM

## 2022-01-29 DIAGNOSIS — M79651 Pain in right thigh: Secondary | ICD-10-CM

## 2022-01-29 DIAGNOSIS — M79671 Pain in right foot: Secondary | ICD-10-CM

## 2022-01-29 DIAGNOSIS — M6281 Muscle weakness (generalized): Secondary | ICD-10-CM | POA: Diagnosis not present

## 2022-02-01 ENCOUNTER — Encounter (HOSPITAL_BASED_OUTPATIENT_CLINIC_OR_DEPARTMENT_OTHER): Payer: BC Managed Care – PPO | Admitting: Physical Therapy

## 2022-02-07 ENCOUNTER — Ambulatory Visit (HOSPITAL_BASED_OUTPATIENT_CLINIC_OR_DEPARTMENT_OTHER): Payer: BC Managed Care – PPO | Attending: Family Medicine | Admitting: Physical Therapy

## 2022-02-07 ENCOUNTER — Encounter (HOSPITAL_BASED_OUTPATIENT_CLINIC_OR_DEPARTMENT_OTHER): Payer: Self-pay | Admitting: Physical Therapy

## 2022-02-07 DIAGNOSIS — M79671 Pain in right foot: Secondary | ICD-10-CM | POA: Insufficient documentation

## 2022-02-07 DIAGNOSIS — M6281 Muscle weakness (generalized): Secondary | ICD-10-CM | POA: Insufficient documentation

## 2022-02-07 DIAGNOSIS — M25551 Pain in right hip: Secondary | ICD-10-CM | POA: Diagnosis not present

## 2022-02-07 DIAGNOSIS — M5459 Other low back pain: Secondary | ICD-10-CM | POA: Diagnosis not present

## 2022-02-07 DIAGNOSIS — M79651 Pain in right thigh: Secondary | ICD-10-CM | POA: Diagnosis not present

## 2022-02-07 DIAGNOSIS — R2689 Other abnormalities of gait and mobility: Secondary | ICD-10-CM | POA: Insufficient documentation

## 2022-02-07 NOTE — Therapy (Signed)
OUTPATIENT PHYSICAL THERAPY TREATMENT NOTE   Patient Name: Yolanda Roberson MRN: [id number] DOB:[birth year], 61 y.o., female Today's Date: 02/07/2022  PCP:  Caren Macadam, MD  REFERRING PROVIDER: Almedia Balls, NP  END OF SESSION:   PT End of Session - 02/07/22 1533     Visit Number 15    Number of Visits 21    Date for PT Re-Evaluation 04/21/22    Authorization Type BCBS COMM PPO    PT Start Time 5809    PT Stop Time 1615    PT Time Calculation (min) 40 min    Activity Tolerance Patient tolerated treatment well    Behavior During Therapy [facility] for tasks assessed/performed                  History reviewed. No pertinent past medical history. History reviewed. No pertinent surgical history. Patient Active Problem List   Diagnosis Date Noted   Spondylolisthesis, lumbar region 12/13/2021    REFERRING DIAG: Spondylolisthesis, lumbar region [M43.16], Radiculopathy, lumbar region [M54.16], Other specified joint disorders, right hip [M25.851], Pain in right leg [M79.604]  THERAPY DIAG:  Other low back pain  Pain in right hip  Pain in right thigh  Rationale for Evaluation and Treatment Rehabilitation   PERTINENT HISTORY: None  PRECAUTIONS: None  ONSET DATE: a year ago   SUBJECTIVE:                                                                                                                                                                                            SUBJECTIVE STATEMENT:   Just getting out of work.  Pt reports decrease in pain sx after last session for up to 2 days  Eval:  Pt states that she had an insidious onset of R dorsum foot pain that started about a year ago. She states that it has progressively gotten worse resulting in lateral pain from her foot to her R hip and into her lower back Numbness and tingling present only at the dorsum of her foot. She reports remainder of lateral pain being sharp.    PAIN:  Are you having pain? Yes: NPRS  scale: 6/10   Pain location: back, radiating down R lateral leg Pain description: Sharp pain in lateral R leg, Pulling in HS Aggravating factors: Any movement.  Relieving factors: Nothing  OBJECTIVE:    DIAGNOSTIC FINDINGS:  X- ray done of lumbar spine. No interpretation present yet.    PATIENT SURVEYS:  FOTO 41%, predicted 47% in 12 visits.   FOTO 10/16 42 pts    PALPATION: Pt tender to palpation along entire R lateral LE.  Tenderness  in lateral lumbar paraspinals.    LUMBAR ROM:    Active  A/PROM  10/16  Flexion WFL  Extension WFL  Right lateral flexion 75% with radiating p!  Left lateral flexion 75 with pulling   Right rotation WFL   Left rotation WFL    (Blank rows = not tested)   LOWER EXTREMITY ROM:      Active  Right eval Left eval  Hip flexion [facility]  Hip extension      Hip abduction      Hip adduction      Hip internal rotation [facility]  Hip external rotation [facility]  Knee flexion [facility] WFL  Knee extension WFL WFL   (Blank rows = not tested)   LOWER EXTREMITY MMT:     MMT Right 10/16 Left eval  Hip flexion 4/5 4/5  Hip extension      Hip abduction      Hip adduction      Hip internal rotation      Hip external rotation      Knee flexion 4/5 4+/5  Knee extension 5/5 5   (Blank rows = not tested)   GAIT: Distance walked: Within clinic (28f)  Assistive device utilized: None Level of assistance: Complete Independence Comments: Decreased R stance time, exaggerated lumbar lordosis     TODAY'S TREATMENT  11/2: Pt seen for aquatic therapy today.  Treatment took place in water 3.25-4.5 ft in [place] Temp of water was 93.  Pt entered/exited the pool via stairs with supervision with bilat rail.   * holding onto yellow noodle:   forward walking, backward walking, cues for step length * side stepping holding yellow hand floats R/L  *Exaggerated L stretch feet on 2nd step hand on hand rail 3x30s hold. 4th rep with  R/L hip hiking x4; figure 4 * Seated on lift chair submerged:  cycling, add/abd 2x20-25 * at wall: hip ext x 10, hp abd/add x 10 ; squats x10 *seated on squoodle hands on wall: posterior pelvic tilt, hip hiking, rotation *kick board push pull for core engagement. Cues for abdominal bracing and glue isometric hold * at wall: Rt hip flexor/ calf stretch x 3 reps, 30 sec hold      PATIENT EDUCATION:  Education details:exercise progression, Person educated: Patient Education method: ECustomer service managerEducation comprehension: verbalized understanding and returned demonstration     HOME EXERCISE PROGRAM: Access Code: [email or web address] URL: [email or web address] Date: 11/26/2021 Prepared by: SRudi Heap  ASSESSMENT:   CLINICAL IMPRESSION: Pt appears tired today.  She reports its the same "old thing" constant pain tiring her. Focus on stretching and strengthening of core and pain relief. She reports slight reduction in pain by 1/2 to1 point. She remains guarded throughout session. Finishes in jLymanfor water massage (unbilled). Goals ongoing.      OBJECTIVE IMPAIRMENTS Abnormal gait, decreased activity tolerance, decreased balance, decreased endurance, decreased knowledge of condition, decreased mobility, difficulty walking, decreased ROM, decreased strength, obesity, and pain.    ACTIVITY LIMITATIONS carrying, lifting, bending, sitting, standing, squatting, sleeping, stairs, bed mobility, bathing, dressing, and locomotion level   PARTICIPATION LIMITATIONS: meal prep, cleaning, laundry, driving, community activity, and occupation   PERSONAL FACTORS Education and Profession are also affecting patient's functional outcome.    REHAB POTENTIAL: Good   CLINICAL DECISION MAKING: Evolving/moderate complexity   EVALUATION COMPLEXITY: Moderate     GOALS: Goals reviewed with patient? No   SHORT TERM GOALS: Target date: 12/24/2021  Pt will be I and compliant with  initial HEP. Baseline: Goal status: Achieved    2.  Pt will report </= 3/10 pain at baseline             Baseline:7/10 Goal status:Ongoing    LONG TERM GOALS: Target date: 05/02/2022    Pt will be independent with advanced HEP to continue to address postural limitations and muscle imbalances. Baseline:  Goal status: ongoing   2.  Pt will report </= 3/10 pain at baseline                        Baseline: Goal status: ongoing   3.  Pt will improve FOTO function score to no less than 47% as proxy for functional improvement Baseline:  Goal status: ongoing   4.  Pt will be able to run errands for her family without familiar pain.  Baseline: Unable to do anything beyond work.  Goal status: ongoing   5.  Pt will improve her MMT's to 4/5 globally.  Baseline: See chart above  Goal status: met   6.  Pt will ambulate with normal gait pattern.  Baseline:  Goal status: ongoing     PLAN: PT FREQUENCY: 1-2x/week   PT DURATION: 12 weeks(likely DC in 8 wks)   PLANNED INTERVENTIONS: Therapeutic exercises, Therapeutic activity, Neuromuscular re-education, Balance training, Gait training, Patient/Family education, Self Care, Joint mobilization, Joint manipulation, Stair training, Aquatic Therapy, Dry Needling, Electrical stimulation, Spinal manipulation, Spinal mobilization, Cryotherapy, Moist heat, Taping, Vasopneumatic device, Traction, Ultrasound, Biofeedback, Ionotophoresis 59m/ml Dexamethasone, Manual therapy, and Re-evaluation.   PLAN FOR NEXT SESSION:  alternating land and pool; land STM and joint mobs in addition to abdominal bracing activity, revisit post pelvic tilt at next   MStanton Kidney(Tharon Aquas Elianis Fischbach MPT 02/07/22 6:49 PM [place] [address]Phone: [phone]  Fax:  [phone]

## 2022-02-08 ENCOUNTER — Ambulatory Visit (HOSPITAL_BASED_OUTPATIENT_CLINIC_OR_DEPARTMENT_OTHER): Payer: BC Managed Care – PPO | Admitting: Physical Therapy

## 2022-02-08 DIAGNOSIS — M5459 Other low back pain: Secondary | ICD-10-CM

## 2022-02-08 DIAGNOSIS — M6281 Muscle weakness (generalized): Secondary | ICD-10-CM | POA: Diagnosis not present

## 2022-02-08 DIAGNOSIS — M79651 Pain in right thigh: Secondary | ICD-10-CM

## 2022-02-08 DIAGNOSIS — M79671 Pain in right foot: Secondary | ICD-10-CM

## 2022-02-08 DIAGNOSIS — M25551 Pain in right hip: Secondary | ICD-10-CM

## 2022-02-08 DIAGNOSIS — R2689 Other abnormalities of gait and mobility: Secondary | ICD-10-CM | POA: Diagnosis not present

## 2022-02-08 NOTE — Therapy (Signed)
OUTPATIENT PHYSICAL THERAPY TREATMENT NOTE   Patient Name: Yolanda Roberson MRN: [id number] DOB:Jan 23, 1961, 61 y.o., female Today's Date: 02/07/2022  PCP:  Caren Macadam, MD  REFERRING PROVIDER: Almedia Balls, NP  END OF SESSION:   PT End of Session - 02/07/22 1533     Visit Number 15    Number of Visits 21    Date for PT Re-Evaluation 04/21/22    Authorization Type [employer] COMM PPO    PT Start Time 4496    PT Stop Time 1615    PT Time Calculation (min) 40 min    Activity Tolerance Patient tolerated treatment well    Behavior During Therapy [facility] for tasks assessed/performed                  History reviewed. No pertinent past medical history. History reviewed. No pertinent surgical history. Patient Active Problem List   Diagnosis Date Noted   Spondylolisthesis, lumbar region 12/13/2021    REFERRING DIAG: Spondylolisthesis, lumbar region [M43.16], Radiculopathy, lumbar region [M54.16], Other specified joint disorders, right hip [M25.851], Pain in right leg [M79.604]  THERAPY DIAG:  Other low back pain  Pain in right hip  Pain in right thigh  Rationale for Evaluation and Treatment Rehabilitation   PERTINENT HISTORY: None  PRECAUTIONS: None  ONSET DATE: a year ago   SUBJECTIVE:                                                                                                                                                                                            SUBJECTIVE STATEMENT:   The patient reports that the pain is better when she gets out of the pool but quickly returns. She is doing a few things she has been given on land. She comes in today putting very little weight on her right leg with a significant lateral shift. She has limited hip flexion on the right side.   Eval:  Pt states that she had an insidious onset of R dorsum foot pain that started about a year ago. She states that it has progressively gotten worse resulting in lateral pain from her  foot to her R hip and into her lower back Numbness and tingling present only at the dorsum of her foot. She reports remainder of lateral pain being sharp.    PAIN:  Are you having pain? Yes: NPRS scale: 7/10   Pain location: back, radiating down R lateral leg Pain description: Sharp pain in lateral R leg, Pulling in HS Aggravating factors: Any movement.  Relieving factors: Nothing  OBJECTIVE:    DIAGNOSTIC FINDINGS:  X- ray done of lumbar spine. No  interpretation present yet.    PATIENT SURVEYS:  FOTO 41%, predicted 47% in 12 visits.   FOTO 10/16 42 pts    PALPATION: Pt tender to palpation along entire R lateral LE.  Tenderness in lateral lumbar paraspinals.    LUMBAR ROM:    Active  A/PROM  10/16  Flexion WFL  Extension WFL  Right lateral flexion 75% with radiating p!  Left lateral flexion 75 with pulling   Right rotation WFL   Left rotation WFL    (Blank rows = not tested)   LOWER EXTREMITY ROM:      Active  Right eval Left eval  Hip flexion [facility]  Hip extension      Hip abduction      Hip adduction      Hip internal rotation [facility]  Hip external rotation [facility]  Knee flexion [facility] WFL  Knee extension WFL WFL   (Blank rows = not tested)   LOWER EXTREMITY MMT:     MMT Right 10/16 Left eval  Hip flexion 4/5 4/5  Hip extension      Hip abduction      Hip adduction      Hip internal rotation      Hip external rotation      Knee flexion 4/5 4+/5  Knee extension 5/5 5   (Blank rows = not tested)   GAIT: Distance walked: Within clinic (43f)  Assistive device utilized: None Level of assistance: Complete Independence Comments: Decreased R stance time, exaggerated lumbar lordosis     TODAY'S TREATMENT  11/3 Reviewed in extensive detail her HEP. The patient feels like her LTR causes pain. Upon review she is pushing through painful areas. We modified to move within pain free limites. She is doing a nerve flossing activity which is irritating her leg  as well as her small crunch exercise on her HEP. Both were removed. She feels her QL stretch has helped some. She has a PPT for her HEP but she was unable to do it correctly with cuing. She reports she hasn't been doing it at home. Sh ewas given a supine march and a light band hip abduction for home   Trigger Point Dry-Needling  Treatment instructions: Expect mild to moderate muscle soreness. S/S of pneumothorax if dry needled over a lung field, and to seek immediate medical attention should they occur. Patient verbalized understanding of these instructions and education.  Patient Consent Given: Yes Education handout provided: Previously provided Muscles treated: left glut medius and L4 and L5 parapsinal on the right with an L4 and L5  Electrical stimulation performed: No Parameters:    Treatment response/outcome: Great twitch       PATIENT EDUCATION:  Education details:exercise progression, Person educated: Patient Education method: ECustomer service managerEducation comprehension: verbalized understanding and returned demonstration     HOME EXERCISE PROGRAM: Access Code: [email or web address] URL: [email or web address] Date: 11/26/2021 Prepared by: SRudi Heap  ASSESSMENT:   CLINICAL IMPRESSION: The patient's HEP was modified ( see above for detail). She was strongly advised to try her stretches and soft tissue mobilization over the next 2-3 days and see which ones control her pain the most. She was also given two strengthening exercises. She feels like most of her pain in in her hamstring area and her lateral calf. We advised the patient it unlikely she has a hamstring strain and a peroneal strain. We explained her referral pattern is tied to a low back origin and her hamstring pain could  be coming from her gluteal spasm. We needled her gluteal and lower lumbar spine. Both had large trigger points. She felt sore after. She was advised this is a great time to see which  exercises and stretches reduce her soreness. She was given two light exercises for home to target her core and the areas that are likely causing her gait abnormalities.     OBJECTIVE IMPAIRMENTS Abnormal gait, decreased activity tolerance, decreased balance, decreased endurance, decreased knowledge of condition, decreased mobility, difficulty walking, decreased ROM, decreased strength, obesity, and pain.    ACTIVITY LIMITATIONS carrying, lifting, bending, sitting, standing, squatting, sleeping, stairs, bed mobility, bathing, dressing, and locomotion level   PARTICIPATION LIMITATIONS: meal prep, cleaning, laundry, driving, community activity, and occupation   PERSONAL FACTORS Education and Profession are also affecting patient's functional outcome.    REHAB POTENTIAL: Good   CLINICAL DECISION MAKING: Evolving/moderate complexity   EVALUATION COMPLEXITY: Moderate     GOALS: Goals reviewed with patient? No   SHORT TERM GOALS: Target date: 12/24/2021   Pt will be I and compliant with initial HEP. Baseline: Goal status: Achieved    2.  Pt will report </= 3/10 pain at baseline             Baseline:7/10 Goal status:Ongoing    LONG TERM GOALS: Target date: 05/02/2022    Pt will be independent with advanced HEP to continue to address postural limitations and muscle imbalances. Baseline:  Goal status: ongoing   2.  Pt will report </= 3/10 pain at baseline                        Baseline: Goal status: ongoing   3.  Pt will improve FOTO function score to no less than 47% as proxy for functional improvement Baseline:  Goal status: ongoing   4.  Pt will be able to run errands for her family without familiar pain.  Baseline: Unable to do anything beyond work.  Goal status: ongoing   5.  Pt will improve her MMT's to 4/5 globally.  Baseline: See chart above  Goal status: met   6.  Pt will ambulate with normal gait pattern.  Baseline:  Goal status: ongoing     PLAN: PT  FREQUENCY: 1-2x/week   PT DURATION: 12 weeks(likely DC in 8 wks)   PLANNED INTERVENTIONS: Therapeutic exercises, Therapeutic activity, Neuromuscular re-education, Balance training, Gait training, Patient/Family education, Self Care, Joint mobilization, Joint manipulation, Stair training, Aquatic Therapy, Dry Needling, Electrical stimulation, Spinal manipulation, Spinal mobilization, Cryotherapy, Moist heat, Taping, Vasopneumatic device, Traction, Ultrasound, Biofeedback, Ionotophoresis 78m/ml Dexamethasone, Manual therapy, and Re-evaluation.   PLAN FOR NEXT SESSION:  alternating land and pool; land STM and joint mobs in addition to abdominal bracing activity, revisit post pelvic tilt at next   DCarolyne LittlesPT DPT  02/07/22 6:49 PM [place] [address]Phone: [phone]  Fax:  [phone]

## 2022-02-09 ENCOUNTER — Encounter (HOSPITAL_BASED_OUTPATIENT_CLINIC_OR_DEPARTMENT_OTHER): Payer: Self-pay | Admitting: Physical Therapy

## 2022-02-12 DIAGNOSIS — M5416 Radiculopathy, lumbar region: Secondary | ICD-10-CM | POA: Diagnosis not present

## 2022-02-13 ENCOUNTER — Encounter (HOSPITAL_BASED_OUTPATIENT_CLINIC_OR_DEPARTMENT_OTHER): Payer: Self-pay | Admitting: Physical Therapy

## 2022-02-13 ENCOUNTER — Ambulatory Visit (HOSPITAL_BASED_OUTPATIENT_CLINIC_OR_DEPARTMENT_OTHER): Payer: BC Managed Care – PPO | Admitting: Physical Therapy

## 2022-02-13 DIAGNOSIS — M79651 Pain in right thigh: Secondary | ICD-10-CM | POA: Diagnosis not present

## 2022-02-13 DIAGNOSIS — M5459 Other low back pain: Secondary | ICD-10-CM

## 2022-02-13 DIAGNOSIS — M25551 Pain in right hip: Secondary | ICD-10-CM

## 2022-02-13 DIAGNOSIS — M79671 Pain in right foot: Secondary | ICD-10-CM | POA: Diagnosis not present

## 2022-02-13 DIAGNOSIS — R2689 Other abnormalities of gait and mobility: Secondary | ICD-10-CM | POA: Diagnosis not present

## 2022-02-13 DIAGNOSIS — M6281 Muscle weakness (generalized): Secondary | ICD-10-CM | POA: Diagnosis not present

## 2022-02-13 NOTE — Therapy (Signed)
OUTPATIENT PHYSICAL THERAPY TREATMENT NOTE   Patient Name: Yolanda Roberson MRN: [id number] DOB:04/10/1960, 61 y.o., female Today's Date: 02/13/2022  PCP:  Caren Macadam, MD  REFERRING PROVIDER: Almedia Balls, NP  END OF SESSION:   PT End of Session - 02/13/22 1710     Visit Number 17    Number of Visits 21    Date for PT Re-Evaluation 04/21/22    Authorization Type [employer]BS COMM PPO    PT Start Time 0433    PT Stop Time 0513    PT Time Calculation (min) 40 min    Activity Tolerance Patient tolerated treatment well    Behavior During Therapy [facility] for tasks assessed/performed                   History reviewed. No pertinent past medical history. History reviewed. No pertinent surgical history. Patient Active Problem List   Diagnosis Date Noted   Spondylolisthesis, lumbar region 12/13/2021    REFERRING DIAG: Spondylolisthesis, lumbar region [M43.16], Radiculopathy, lumbar region [M54.16], Other specified joint disorders, right hip [M25.851], Pain in right leg [M79.604]  THERAPY DIAG:  Other low back pain  Pain in right hip  Pain in right thigh  Rationale for Evaluation and Treatment Rehabilitation   PERTINENT HISTORY: None  PRECAUTIONS: None  ONSET DATE: a year ago   SUBJECTIVE:                                                                                                                                                                                            SUBJECTIVE STATEMENT:   Pt reports spinal injection yesterday with good relief. Pain relatively low but she has not worked yesterday or today.  Eval:  Pt states that she had an insidious onset of R dorsum foot pain that started about a year ago. She states that it has progressively gotten worse resulting in lateral pain from her foot to her R hip and into her lower back Numbness and tingling present only at the dorsum of her foot. She reports remainder of lateral pain being sharp.    PAIN:  Are  you having pain? Yes: NPRS scale: 2/10   Pain location: back, radiating down R lateral leg Pain description: Sharp pain in lateral R leg, Pulling in HS Aggravating factors: Any movement.  Relieving factors: Nothing  OBJECTIVE:    DIAGNOSTIC FINDINGS:  X- ray done of lumbar spine. No interpretation present yet.    PATIENT SURVEYS:  FOTO 41%, predicted 47% in 12 visits.   FOTO 10/16 42 pts    PALPATION: Pt tender to palpation along entire R lateral LE.  Tenderness  in lateral lumbar paraspinals.    LUMBAR ROM:    Active  A/PROM  10/16  Flexion WFL  Extension WFL  Right lateral flexion 75% with radiating p!  Left lateral flexion 75 with pulling   Right rotation WFL   Left rotation WFL    (Blank rows = not tested)   LOWER EXTREMITY ROM:      Active  Right eval Left eval  Hip flexion [facility] [address]  Hip extension      Hip abduction      Hip adduction      Hip internal rotation [facility]  Hip external rotation [facility]  Knee flexion [facility] WFL  Knee extension WFL WFL   (Blank rows = not tested)   LOWER EXTREMITY MMT:     MMT Right 10/16 Left eval  Hip flexion 4/5 4/5  Hip extension      Hip abduction      Hip adduction      Hip internal rotation      Hip external rotation      Knee flexion 4/5 4+/5  Knee extension 5/5 5   (Blank rows = not tested)   GAIT: Distance walked: Within clinic (86f)  Assistive device utilized: None Level of assistance: Complete Independence Comments: Decreased R stance time, exaggerated lumbar lordosis     TODAY'S TREATMENT   Pt seen for aquatic therapy today.  Treatment took place in water 3.25-4.5 ft in [place] at the [place] Temp of water was 93.  Pt entered/exited the pool via stairs with supervision with bilat rail.   * UE supported 1 foam hand buoy:   forward walking, backward walking, cues for step length * side stepping holding yellow hand floats R/L  *kick board push pull le staggered R/L leading then feet  together for core engagement. Cues for abdominal bracing and glue isometric hold *L stretch feet on 2nd step hand on hand rail 3x30s hold. ; figure 4;piriformis *Standing ue support on wall: QL stretching R/L * Seated on lift chair submerged:  cycling, add/abd 2x20-25 * at wall: hip ext x 10, hp abd/add x 10 ; squats x10 *seated on squoodle hands on wall: posterior pelvic tilt, hip hiking, rotation *1/2 noodle pull down x 10 cues for abd bracing * at wall: Rt hip flexor/ calf stretch x 3 reps, 30 sec hold    11/3 Reviewed in extensive detail her HEP. The patient feels like her LTR causes pain. Upon review she is pushing through painful areas. We modified to move within pain free limites. She is doing a nerve flossing activity which is irritating her leg as well as her small crunch exercise on her HEP. Both were removed. She feels her QL stretch has helped some. She has a PPT for her HEP but she was unable to do it correctly with cuing. She reports she hasn't been doing it at home. Sh ewas given a supine march and a light band hip abduction for home   Trigger Point Dry-Needling  Treatment instructions: Expect mild to moderate muscle soreness. S/S of pneumothorax if dry needled over a lung field, and to seek immediate medical attention should they occur. Patient verbalized understanding of these instructions and education.  Patient Consent Given: Yes Education handout provided: Previously provided Muscles treated: left glut medius and L4 and L5 parapsinal on the right with an L4 and L5  Electrical stimulation performed: No Parameters:    Treatment response/outcome: Great twitch  PATIENT EDUCATION:  Education details:exercise progression, Person educated: Patient Education method: Customer service manager Education comprehension: verbalized understanding and returned demonstration     HOME EXERCISE PROGRAM: Access Code: [email or web address] URL: [email or web address] Date:  11/26/2021 Prepared by: Rudi Heap   ASSESSMENT:   CLINICAL IMPRESSION: Pt had spinal injection yesterday and is reporting decreased LB as well as resolving right lateral calf numbness. No complaints of hamstring issues. She tolerates session without difficulty and without increase pain. Worked on core strengthening and stretching. Added QL stretch. She completes session with improved toleration due to decreased pain sensitivity.      OBJECTIVE IMPAIRMENTS Abnormal gait, decreased activity tolerance, decreased balance, decreased endurance, decreased knowledge of condition, decreased mobility, difficulty walking, decreased ROM, decreased strength, obesity, and pain.    ACTIVITY LIMITATIONS carrying, lifting, bending, sitting, standing, squatting, sleeping, stairs, bed mobility, bathing, dressing, and locomotion level   PARTICIPATION LIMITATIONS: meal prep, cleaning, laundry, driving, community activity, and occupation   PERSONAL FACTORS Education and Profession are also affecting patient's functional outcome.    REHAB POTENTIAL: Good   CLINICAL DECISION MAKING: Evolving/moderate complexity   EVALUATION COMPLEXITY: Moderate     GOALS: Goals reviewed with patient? No   SHORT TERM GOALS: Target date: 12/24/2021   Pt will be I and compliant with initial HEP. Baseline: Goal status: Achieved    2.  Pt will report </= 3/10 pain at baseline             Baseline:7/10 Goal status:Ongoing    LONG TERM GOALS: Target date: 05/02/2022    Pt will be independent with advanced HEP to continue to address postural limitations and muscle imbalances. Baseline:  Goal status: ongoing   2.  Pt will report </= 3/10 pain at baseline                        Baseline: Goal status: ongoing   3.  Pt will improve FOTO function score to no less than 47% as proxy for functional improvement Baseline:  Goal status: ongoing   4.  Pt will be able to run errands for her family without familiar pain.   Baseline: Unable to do anything beyond work.  Goal status: ongoing   5.  Pt will improve her MMT's to 4/5 globally.  Baseline: See chart above  Goal status: met   6.  Pt will ambulate with normal gait pattern.  Baseline:  Goal status: ongoing     PLAN: PT FREQUENCY: 1-2x/week   PT DURATION: 12 weeks(likely DC in 8 wks)   PLANNED INTERVENTIONS: Therapeutic exercises, Therapeutic activity, Neuromuscular re-education, Balance training, Gait training, Patient/Family education, Self Care, Joint mobilization, Joint manipulation, Stair training, Aquatic Therapy, Dry Needling, Electrical stimulation, Spinal manipulation, Spinal mobilization, Cryotherapy, Moist heat, Taping, Vasopneumatic device, Traction, Ultrasound, Biofeedback, Ionotophoresis 35m/ml Dexamethasone, Manual therapy, and Re-evaluation.   PLAN FOR NEXT SESSION:  alternating land and pool; land STM and joint mobs in addition to abdominal bracing activity, revisit post pelvic tilt at next   MStanton Kidney(Tharon Aquas Gabreal Worton MPT 02/13/22 5:13 PM CAristocrat Ranchettes[address] [address]Phone: [phone]  Fax:  [phone]

## 2022-02-15 ENCOUNTER — Encounter (HOSPITAL_BASED_OUTPATIENT_CLINIC_OR_DEPARTMENT_OTHER): Payer: Self-pay | Admitting: Physical Therapy

## 2022-02-15 ENCOUNTER — Ambulatory Visit (HOSPITAL_BASED_OUTPATIENT_CLINIC_OR_DEPARTMENT_OTHER): Payer: BC Managed Care – PPO | Admitting: Physical Therapy

## 2022-02-15 DIAGNOSIS — M5459 Other low back pain: Secondary | ICD-10-CM | POA: Diagnosis not present

## 2022-02-15 DIAGNOSIS — M79671 Pain in right foot: Secondary | ICD-10-CM

## 2022-02-15 DIAGNOSIS — M79651 Pain in right thigh: Secondary | ICD-10-CM | POA: Diagnosis not present

## 2022-02-15 DIAGNOSIS — M6281 Muscle weakness (generalized): Secondary | ICD-10-CM | POA: Diagnosis not present

## 2022-02-15 DIAGNOSIS — M25551 Pain in right hip: Secondary | ICD-10-CM

## 2022-02-15 DIAGNOSIS — R2689 Other abnormalities of gait and mobility: Secondary | ICD-10-CM | POA: Diagnosis not present

## 2022-02-15 NOTE — Therapy (Signed)
OUTPATIENT PHYSICAL THERAPY TREATMENT NOTE   Patient Name: Yolanda Roberson MRN: [id number] DOB:01/12/1961, 61 y.o., female Today's Date: 02/13/2022  PCP:  Caren Macadam, MD  REFERRING PROVIDER: Almedia Balls, NP  END OF SESSION:   PT End of Session - 02/13/22 1710     Visit Number 17    Number of Visits 21    Date for PT Re-Evaluation 04/21/22    Authorization Type BCBS COMM PPO    PT Start Time 0433    PT Stop Time 0513    PT Time Calculation (min) 40 min    Activity Tolerance Patient tolerated treatment well    Behavior During Therapy [facility] for tasks assessed/performed                   History reviewed. No pertinent past medical history. History reviewed. No pertinent surgical history. Patient Active Problem List   Diagnosis Date Noted   Spondylolisthesis, lumbar region 12/13/2021    REFERRING DIAG: Spondylolisthesis, lumbar region [M43.16], Radiculopathy, lumbar region [M54.16], Other specified joint disorders, right hip [M25.851], Pain in right leg [M79.604]  THERAPY DIAG:  Other low back pain  Pain in right hip  Pain in right thigh  Rationale for Evaluation and Treatment Rehabilitation   PERTINENT HISTORY: None  PRECAUTIONS: None  ONSET DATE: a year ago   SUBJECTIVE:                                                                                                                                                                                            SUBJECTIVE STATEMENT:   Pt reports her back is doing better. She feels like the injection worked well. She has only worked 1 day in the past few days.   Eval:  Pt states that she had an insidious onset of R dorsum foot pain that started about a year ago. She states that it has progressively gotten worse resulting in lateral pain from her foot to her R hip and into her lower back Numbness and tingling present only at the dorsum of her foot. She reports remainder of lateral pain being sharp.    PAIN:   Are you having pain? Yes: NPRS scale: 2/10   Pain location: back, radiating down R lateral leg Pain description: Sharp pain in lateral R leg, Pulling in HS Aggravating factors: Any movement.  Relieving factors: Nothing  OBJECTIVE:    DIAGNOSTIC FINDINGS:  X- ray done of lumbar spine. No interpretation present yet.    PATIENT SURVEYS:  FOTO 41%, predicted 47% in 12 visits.   FOTO 10/16 42 pts    PALPATION: Pt tender to palpation  along entire R lateral LE.  Tenderness in lateral lumbar paraspinals.    LUMBAR ROM:    Active  A/PROM  10/16  Flexion WFL  Extension WFL  Right lateral flexion 75% with radiating p!  Left lateral flexion 75 with pulling   Right rotation WFL   Left rotation WFL    (Blank rows = not tested)   LOWER EXTREMITY ROM:      Active  Right eval Left eval  Hip flexion [facility]  Hip extension      Hip abduction      Hip adduction      Hip internal rotation [facility]  Hip external rotation [facility]  Knee flexion [facility] WFL  Knee extension WFL WFL   (Blank rows = not tested)   LOWER EXTREMITY MMT:     MMT Right 10/16 Left eval  Hip flexion 4/5 4/5  Hip extension      Hip abduction      Hip adduction      Hip internal rotation      Hip external rotation      Knee flexion 4/5 4+/5  Knee extension 5/5 5   (Blank rows = not tested)   GAIT: Distance walked: Within clinic (67f)  Assistive device utilized: None Level of assistance: Complete Independence Comments: Decreased R stance time, exaggerated lumbar lordosis     TODAY'S TREATMENT  11/10 rig[place] Dry-Needling  Treatment instructions: Expect mild to moderate muscle soreness. S/S of pneumothorax if dry needled over a lung field, and to seek immediate medical attention should they occur. Patient verbalized understanding of these instructions and education.   Patient Consent Given: Yes Education handout provided: Previously provided Muscles treated: right glut min in 3  spots Electrical stimulation performed: No Parameters:    Treatment response/outcome: Great twitch    Manual: trigger point release to glut min and medius n both sides; skilled pallationof trigger points. LAD with grade II and III ocillations on the right grade III and IV oscillations on the left.   Supine LTR x10  Gluteal stretch 3x20 sec hold bilateral   Supine march 2x10  Supine hip abdcution 2x10 red   Supine bridge x10          Last visit  Pt seen for aquatic therapy today.  Treatment took place in water 3.25[place]4.[place] Temp of water was 93.  Pt entered/exited the pool via stairs with supervision with bilat rail.   * UE supported 1 foam hand buoy:   forward walking, backward walking, cues for step length * side stepping holding yellow hand floats R/L  *kick board push pull le staggered R/L leading then feet together for core engagement. Cues for abdominal bracing and glue isometric hold *L stretch feet on 2nd step hand on hand rail 3x30s hold. ; figure 4;piriformis *Standing ue support on wall: QL stretching R/L * Seated on lift chair submerged:  cycling, add/abd 2x20-25 * at wall: hip ext x 10, hp abd/add x 10 ; squats x10 *seated on squoodle hands on wall: posterior pelvic tilt, hip hiking, rotation *1/2 noodle pull down x 10 cues for abd bracing * at wall: Rt hip flexor/ calf stretch x 3 reps, 30 sec hold    11/3 Reviewed in extensive detail her HEP. The patient feels like her LTR causes pain. Upon review she is pushing through painful areas. We modified to move within pain free limites. She is doing a nerve flossing  activity which is irritating her leg as well as her small crunch exercise on her HEP. Both were removed. She feels her QL stretch has helped some. She has a PPT for her HEP but she was unable to do it correctly with cuing. She reports she hasn't been doing it at home. Sh ewas given a supine march and a light band hip  abduction for home   Trigger Point Dry-Needling  Treatment instructions: Expect mild to moderate muscle soreness. S/S of pneumothorax if dry needled over a lung field, and to seek immediate medical attention should they occur. Patient verbalized understanding of these instructions and education.  Patient Consent Given: Yes Education handout provided: Previously provided Muscles treated: left glut medius and L4 and L5 parapsinal on the right with an L4 and L5  Electrical stimulation performed: No Parameters:    Treatment response/outcome: Great twitch       PATIENT EDUCATION:  Education details:exercise progression, Person educated: Patient Education method: Customer service manager Education comprehension: verbalized understanding and returned demonstration     HOME EXERCISE PROGRAM: Access Code: [email or web address] URL: [email or web address] Date: 11/26/2021 Prepared by: Rudi Heap   ASSESSMENT:   CLINICAL IMPRESSION: The patient is making some progress. She continues to have pain and muscle spasming but she tolerated manual therapy and exercises better today. She also has less of an antalgic gait coming in today. We emphasized 4 exercises for home over the next week. Her pain levels are dropping. She was encouraged to get more aggressive with her strengthening both in the pool and on land. We will continue t progress as tolerated.      OBJECTIVE IMPAIRMENTS Abnormal gait, decreased activity tolerance, decreased balance, decreased endurance, decreased knowledge of condition, decreased mobility, difficulty walking, decreased ROM, decreased strength, obesity, and pain.    ACTIVITY LIMITATIONS carrying, lifting, bending, sitting, standing, squatting, sleeping, stairs, bed mobility, bathing, dressing, and locomotion level   PARTICIPATION LIMITATIONS: meal prep, cleaning, laundry, driving, community activity, and occupation   PERSONAL FACTORS Education and Profession are  also affecting patient's functional outcome.    REHAB POTENTIAL: Good   CLINICAL DECISION MAKING: Evolving/moderate complexity   EVALUATION COMPLEXITY: Moderate     GOALS: Goals reviewed with patient? No   SHORT TERM GOALS: Target date: 12/24/2021   Pt will be I and compliant with initial HEP. Baseline: Goal status: Achieved    2.  Pt will report </= 3/10 pain at baseline             Baseline:7/10 Goal status:Ongoing    LONG TERM GOALS: Target date: 05/02/2022    Pt will be independent with advanced HEP to continue to address postural limitations and muscle imbalances. Baseline:  Goal status: ongoing   2.  Pt will report </= 3/10 pain at baseline                        Baseline: Goal status: ongoing   3.  Pt will improve FOTO function score to no less than 47% as proxy for functional improvement Baseline:  Goal status: ongoing   4.  Pt will be able to run errands for her family without familiar pain.  Baseline: Unable to do anything beyond work.  Goal status: ongoing   5.  Pt will improve her MMT's to 4/5 globally.  Baseline: See chart above  Goal status: met   6.  Pt will ambulate with normal gait pattern.  Baseline:  Goal status: ongoing  PLAN: PT FREQUENCY: 1-2x/week   PT DURATION: 12 weeks(likely DC in 8 wks)   PLANNED INTERVENTIONS: Therapeutic exercises, Therapeutic activity, Neuromuscular re-education, Balance training, Gait training, Patient/Family education, Self Care, Joint mobilization, Joint manipulation, Stair training, Aquatic Therapy, Dry Needling, Electrical stimulation, Spinal manipulation, Spinal mobilization, Cryotherapy, Moist heat, Taping, Vasopneumatic device, Traction, Ultrasound, Biofeedback, Ionotophoresis 17m/ml Dexamethasone, Manual therapy, and Re-evaluation.   PLAN FOR NEXT SESSION:  alternating land and pool; land STM and joint mobs in addition to abdominal bracing activity, review exercises and progress as tolerated. Consider  upper body core strengthening for days when she is working more.   DCarolyne LittlesPT DPT  02/13/22 5:13 PM [facility] [address] [place] [address]Phone: [phone]  Fax:  [phone]

## 2022-02-18 NOTE — Therapy (Signed)
OUTPATIENT PHYSICAL THERAPY TREATMENT NOTE   Patient Name: Yolanda Roberson MRN: [id number] DOB:Apr 06, 1961, 61 y.o., female Today's Date: 02/19/2022  PCP:  Caren Macadam, MD  REFERRING PROVIDER: Almedia Balls, NP  END OF SESSION:   PT End of Session - 02/19/22 1627     Visit Number 19    Number of Visits 21    Date for PT Re-Evaluation 04/21/22    Authorization Type BCBS COMM PPO    PT Start Time 1616    PT Stop Time 1700    PT Time Calculation (min) 44 min    Activity Tolerance Patient tolerated treatment well    Behavior During Therapy WFL for tasks assessed/performed                    History reviewed. No pertinent past medical history. History reviewed. No pertinent surgical history. Patient Active Problem List   Diagnosis Date Noted   Spondylolisthesis, lumbar region 12/13/2021    REFERRING DIAG: Spondylolisthesis, lumbar region [M43.16], Radiculopathy, lumbar region [M54.16], Other specified joint disorders, right hip [M25.851], Pain in right leg [M79.604]  THERAPY DIAG:  Other low back pain  Pain in right hip  Pain in right thigh  Muscle weakness (generalized)  Rationale for Evaluation and Treatment Rehabilitation   PERTINENT HISTORY: None  PRECAUTIONS: None  ONSET DATE: a year ago   SUBJECTIVE:                                                                                                                                                                                            SUBJECTIVE STATEMENT:   Pt coming from work reports increased LBP with radiculopathy into r leg  Eval:  Pt states that she had an insidious onset of R dorsum foot pain that started about a year ago. She states that it has progressively gotten worse resulting in lateral pain from her foot to her R hip and into her lower back Numbness and tingling present only at the dorsum of her foot. She reports remainder of lateral pain being sharp.    PAIN:  Are you having  pain? Yes: NPRS scale: 5/10   Pain location: back, radiating down R lateral leg Pain description: Sharp pain in lateral R leg, Pulling in HS Aggravating factors: Any movement.  Relieving factors: Nothing  OBJECTIVE:    DIAGNOSTIC FINDINGS:  X- ray done of lumbar spine. No interpretation present yet.    PATIENT SURVEYS:  FOTO 41%, predicted 47% in 12 visits.   FOTO 10/16 42 pts    PALPATION: Pt tender to palpation along entire R lateral LE.  Tenderness in lateral  lumbar paraspinals.    LUMBAR ROM:    Active  A/PROM  10/16  Flexion WFL  Extension WFL  Right lateral flexion 75% with radiating p!  Left lateral flexion 75 with pulling   Right rotation WFL   Left rotation WFL    (Blank rows = not tested)   LOWER EXTREMITY ROM:      Active  Right eval Left eval  Hip flexion [facility]  Hip extension      Hip abduction      Hip adduction      Hip internal rotation [facility]  Hip external rotation [facility]  Knee flexion [facility] WFL  Knee extension WFL WFL   (Blank rows = not tested)   LOWER EXTREMITY MMT:     MMT Right 10/16 Left eval  Hip flexion 4/5 4/5  Hip extension      Hip abduction      Hip adduction      Hip internal rotation      Hip external rotation      Knee flexion 4/5 4+/5  Knee extension 5/5 5   (Blank rows = not tested)   GAIT: Distance walked: Within clinic (24f)  Assistive device utilized: None Level of assistance: Complete [place] Comments: Decreased R stance time, exaggerated lumbar lordosis     TODAY'S TREATMENT  Pt seen for aquatic therapy today.  Treatment took place [place] 3.25[place]4.[place] of [place] was 93.  Pt entered/exited the pool via stairs with supervision with bilat rail.   * UE supported 1 foam hand buoy:   forward walking, backward walking, cues for step length * side stepping holding yellow hand floats R/L  *L stretch on wall 3x30s hold, 4th reps with hip hiking R/L;  * Seated  : sciatic nerve slide 2x10; figure 4 stretch:  cycling, add/abd 2x20-25 *seated on squoodle hands on wall: posterior pelvic tilt *Standing ue support on wall: QL stretching R/L *kick board push pull le staggered R/L leading then feet together for core engagement. Cues for abdominal bracing and glue isometric hold *1/2 noodle pull down 2x 10 cues for abd bracing *cycling on yellow noodle holding to corner of pool    last 11/10 Trigger [place] Dry-Needling  Treatment instructions: Expect mild to moderate muscle soreness. S/S of pneumothorax if dry needled over a lung field, and to seek immediate medical attention should they occur. Patient verbalized understanding of these instructions and education.   Patient Consent Given: Yes Education handout provided: Previously provided Muscles treated: right glut min in 3 spots Electrical stimulation performed: No Parameters:    Treatment response/outcome: Great twitch    Manual: trigger point release to glut min and medius n both sides; skilled pallationof trigger points. LAD with grade II and III ocillations on the right grade III and IV oscillations on the left.   Supine LTR x10  Gluteal stretch 3x20 sec hold bilateral   Supine march 2x10  Supine hip abdcution 2x10 red   Supine bridge x10         PATIENT EDUCATION:  Education details:exercise progression, Person educated: Patient Education method: ECustomer service managerEducation comprehension: verbalized understanding and returned demonstration     HOME EXERCISE PROGRAM: Access Code: [email or web address] URL: [email or web address] Date: 11/26/2021 Prepared by: SRudi Heap  ASSESSMENT:   CLINICAL IMPRESSION: Pt flared coming from work. She is guarded with her amb and all movements. Radicular pain into right calf.  Of note right  hip with limited ER of hip. Worked on stretching as tolerated. Focused on stretching and neurtral spine to relieve radiculopathy. Needs UE  support on wall with cycling on noodle intended to be completed for some spinal distraction. She reports compliance with land based stretching and exercises.  She is enciuraged to try to find a place at work in middle of day to complete exercises as able. Reduced pain 1-2 points by end of session. Goals ongoing   OBJECTIVE IMPAIRMENTS Abnormal gait, decreased activity tolerance, decreased balance, decreased endurance, decreased knowledge of condition, decreased mobility, difficulty walking, decreased ROM, decreased strength, obesity, and pain.    ACTIVITY LIMITATIONS carrying, lifting, bending, sitting, standing, squatting, sleeping, stairs, bed mobility, bathing, dressing, and locomotion level   PARTICIPATION LIMITATIONS: meal prep, cleaning, laundry, driving, community activity, and occupation   PERSONAL FACTORS Education and Profession are also affecting patient's functional outcome.    REHAB POTENTIAL: Good   CLINICAL DECISION MAKING: Evolving/moderate complexity   EVALUATION COMPLEXITY: Moderate     GOALS: Goals reviewed with patient? No   SHORT TERM GOALS: Target date: 12/24/2021   Pt will be I and compliant with initial HEP. Baseline: Goal status: Achieved    2.  Pt will report </= 3/10 pain at baseline             Baseline:7/10 Goal status:Ongoing    LONG TERM GOALS: Target date: 05/02/2022    Pt will be independent with advanced HEP to continue to address postural limitations and muscle imbalances. Baseline:  Goal status: ongoing   2.  Pt will report </= 3/10 pain at baseline                        Baseline: Goal status: ongoing   3.  Pt will improve FOTO function score to no less than 47% as proxy for functional improvement Baseline:  Goal status: ongoing   4.  Pt will be able to run errands for her family without familiar pain.  Baseline: Unable to do anything beyond work.  Goal status: ongoing   5.  Pt will improve her MMT's to 4/5 globally.  Baseline: See  chart above  Goal status: met   6.  Pt will ambulate with normal gait pattern.  Baseline:  Goal status: ongoing     PLAN: PT FREQUENCY: 1-2x/week   PT DURATION: 12 weeks(likely DC in 8 wks)   PLANNED INTERVENTIONS: Therapeutic exercises, Therapeutic activity, Neuromuscular re-education, Balance training, Gait training, Patient/Family education, Self Care, Joint mobilization, Joint manipulation, Stair training, Aquatic Therapy, Dry Needling, Electrical stimulation, Spinal manipulation, Spinal mobilization, Cryotherapy, Moist heat, Taping, Vasopneumatic device, Traction, Ultrasound, Biofeedback, Ionotophoresis 32m/ml Dexamethasone, Manual therapy, and Re-evaluation.   PLAN FOR NEXT SESSION:  alternating land and pool; land STM and joint mobs in addition to abdominal bracing activity, review exercises and progress as tolerated. Consider upper body core strengthening for days when she is working more.   MStanton Kidney([place] Pranish Akhavan MPT 02/19/22 4:29 PM [facility] [address] [address]Phone: [phone]  Fax:  [phone]

## 2022-02-19 ENCOUNTER — Encounter (HOSPITAL_BASED_OUTPATIENT_CLINIC_OR_DEPARTMENT_OTHER): Payer: Self-pay | Admitting: Physical Therapy

## 2022-02-19 ENCOUNTER — Ambulatory Visit (HOSPITAL_BASED_OUTPATIENT_CLINIC_OR_DEPARTMENT_OTHER): Payer: BC Managed Care – PPO | Admitting: Physical Therapy

## 2022-02-19 DIAGNOSIS — M79651 Pain in right thigh: Secondary | ICD-10-CM

## 2022-02-19 DIAGNOSIS — M5459 Other low back pain: Secondary | ICD-10-CM | POA: Diagnosis not present

## 2022-02-19 DIAGNOSIS — M6281 Muscle weakness (generalized): Secondary | ICD-10-CM

## 2022-02-19 DIAGNOSIS — M25551 Pain in right hip: Secondary | ICD-10-CM

## 2022-02-19 DIAGNOSIS — R2689 Other abnormalities of gait and mobility: Secondary | ICD-10-CM | POA: Diagnosis not present

## 2022-02-19 DIAGNOSIS — M79671 Pain in right foot: Secondary | ICD-10-CM | POA: Diagnosis not present

## 2022-02-21 ENCOUNTER — Encounter (HOSPITAL_BASED_OUTPATIENT_CLINIC_OR_DEPARTMENT_OTHER): Payer: Self-pay | Admitting: Physical Therapy

## 2022-02-21 ENCOUNTER — Ambulatory Visit (HOSPITAL_BASED_OUTPATIENT_CLINIC_OR_DEPARTMENT_OTHER): Payer: BC Managed Care – PPO | Admitting: Physical Therapy

## 2022-02-21 DIAGNOSIS — R2689 Other abnormalities of gait and mobility: Secondary | ICD-10-CM | POA: Diagnosis not present

## 2022-02-21 DIAGNOSIS — M6281 Muscle weakness (generalized): Secondary | ICD-10-CM | POA: Diagnosis not present

## 2022-02-21 DIAGNOSIS — M25551 Pain in right hip: Secondary | ICD-10-CM

## 2022-02-21 DIAGNOSIS — M5459 Other low back pain: Secondary | ICD-10-CM | POA: Diagnosis not present

## 2022-02-21 DIAGNOSIS — M79651 Pain in right thigh: Secondary | ICD-10-CM

## 2022-02-21 DIAGNOSIS — M79671 Pain in right foot: Secondary | ICD-10-CM

## 2022-02-21 NOTE — Therapy (Signed)
OUTPATIENT PHYSICAL THERAPY TREATMENT NOTE   Patient Name: Yolanda Roberson MRN: [id number] DOB:[birth year], 61 y.o., female Today's Date: 02/21/2022  PCP:  Caren Macadam, MD  REFERRING PROVIDER: Almedia Balls, NP  END OF SESSION:   PT End of Session - 02/21/22 1643     Visit Number 20    Number of Visits 28    Date for PT Re-Evaluation 04/21/22    Authorization Type [employer] COMM PPO    PT Start Time 1600    PT Stop Time 1640    PT Time Calculation (min) 40 min    Activity Tolerance Patient tolerated treatment well    Behavior During Therapy [place] for tasks assessed/performed                     History reviewed. No pertinent past medical history. History reviewed. No pertinent surgical history. Patient Active Problem List   Diagnosis Date Noted   Spondylolisthesis, lumbar region 12/13/2021    REFERRING DIAG: Spondylolisthesis, lumbar region [M43.16], Radiculopathy, lumbar region [M54.16], Other specified joint disorders, right hip [M25.851], Pain in right leg [M79.604]  THERAPY DIAG:  Other low back pain - Plan: PT plan of care cert/re-cert  Pain in right thigh - Plan: PT plan of care cert/re-cert  Pain in right hip - Plan: PT plan of care cert/re-cert  Muscle weakness (generalized) - Plan: PT plan of care cert/re-cert  Pain in right foot - Plan: PT plan of care cert/re-cert  Other abnormalities of gait and mobility - Plan: PT plan of care cert/re-cert  Rationale for Evaluation and Treatment Rehabilitation   PERTINENT HISTORY: None  PRECAUTIONS: None  ONSET DATE: a year ago   SUBJECTIVE:                                                                                                                                                                                            SUBJECTIVE STATEMENT: Pt states she is having heavy pain into the R hip today. It is still into her calf.   Eval:  Pt states that she had an insidious onset of R dorsum foot pain  that started about a year ago. She states that it has progressively gotten worse resulting in lateral pain from her foot to her R hip and into her lower back Numbness and tingling present only at the dorsum of her foot. She reports remainder of lateral pain being sharp.    PAIN:  Are you having pain? Yes: NPRS scale: 6/10   Pain location: back, radiating down R lateral leg Pain description: Sharp pain in lateral R leg, Pulling in HS Aggravating factors:  Any movement.  Relieving factors: Nothing  OBJECTIVE:    DIAGNOSTIC FINDINGS:  X- ray done of lumbar spine. No interpretation present yet.    PATIENT SURVEYS:  FOTO 41%, predicted 47% in 12 visits.   FOTO 10/16 42 pts    PALPATION: Pt tender to palpation along entire R lateral LE.  Tenderness in lateral lumbar paraspinals.  Posture: exaggerated lumbar lordosis with difficulty getting into neutral spine     LUMBAR ROM:    Active  A/PROM  10/16 AROM  11/16  Flexion WFL WFL  Extension [facility] WFL  Right lateral flexion 75% with radiating p! WFL with radiating pain  Left lateral flexion 75 with pulling  WFL with R sided stretch  Right rotation [facility]  WFL  Left rotation [facility]  WFL   (Blank rows = not tested)   LOWER EXTREMITY ROM:      Active  Right eval Left eval  Hip flexion [facility]  Hip extension      Hip abduction      Hip adduction      Hip internal rotation [facility]  Hip external rotation [facility]  Knee flexion [facility]  Knee extension WFL WFL   (Blank rows = not tested)   LOWER EXTREMITY MMT:     MMT Right 10/16 11/16 Left eval  Hip flexion 4/5 4+ 4/5  Hip extension       Hip abduction       Hip adduction       Hip internal rotation       Hip external rotation       Knee flexion 4/5 4+ 4+/5  Knee extension 5/_0 (Blank rows = not tested)   GAIT: Distance walked: Within clinic (78f)  Assistive device utilized: None Level of assistance: Complete Independence Comments: Decreased R stance time,  exaggerated lumbar lordosis     TODAY'S TREATMENT    11/16  STM of lumbar paraspinals and UPA of L1-5 grade III  R LE LAD/manual traction 5 min  Child's pose 5s 10x Child's pose with SB 3x each way 5s hold Supine SKTC 5s 10x    11/14  Pt seen for aquatic therapy today.  Treatment took place in water 3.25-4.5 ft in depth at the [facility] Temp of water was 93.  Pt entered/exited the pool via stairs with supervision with bilat rail.   * UE supported 1 foam hand buoy:   forward walking, backward walking, cues for step length * side stepping holding yellow hand floats R/L  *L stretch on wall 3x30s hold, 4th reps with hip hiking R/L;  * Seated : sciatic nerve slide 2x10; figure 4 stretch:  cycling, add/abd 2x20-25 *seated on squoodle hands on wall: posterior pelvic tilt *Standing ue support on wall: QL stretching R/L *kick board push pull le staggered R/L leading then feet together for core engagement. Cues for abdominal bracing and glue isometric hold *1/2 noodle pull down 2x 10 cues for abd bracing *cycling on yellow noodle holding to corner of pool    last 11/10 Trigger Point Dry-Needling  Treatment instructions: Expect mild to moderate muscle soreness. S/S of pneumothorax if dry needled over a lung field, and to seek immediate medical attention should they occur. Patient verbalized understanding of these instructions and education.   Patient Consent Given: Yes Education handout provided: Previously provided Muscles treated: right glut min in 3 spots Electrical stimulation performed: No Parameters:    Treatment response/outcome: Great twitch  Manual: trigger point release to glut min and medius n both sides; skilled pallationof trigger points. LAD with grade II and III ocillations on the right grade III and IV oscillations on the left.   Supine LTR x10  Gluteal stretch 3x20 sec hold bilateral   Supine march 2x10  Supine hip abdcution 2x10 red    Supine bridge x10         PATIENT EDUCATION:  Education details:exercise progression, exam results, anatomy, centralization of symptoms, avoiding agg factors Person educated: Patient Education method: Explanation and Demonstration Education comprehension: verbalized understanding and returned demonstration     HOME EXERCISE PROGRAM: Access Code: [email or web address] URL: [email or web address] Date: 11/26/2021 Prepared by: Rudi Heap   ASSESSMENT:   CLINICAL IMPRESSION: Patient with increased pain at beginning of session.  Patient able to improve pain with manual therapy with addition of new manual traction that improved distal radiating symptoms.  Patient was then able to follow-up with lumbar flexion stretching that and further increased soft tissue extensibility especially on the right side.  Patient does have right closing pattern pain that reproduces radiating symptoms. Pt advised to use opening patterns for pain reduction. Given the new addition of traction type exercise and continued progress, patient to patient to continue with 4 more weeks of therapy. If no change plan to return patient back to MD.  Patient does have improvement with range of motion as well as strength in lower extremity however still limited by radiating pain into the left lower leg.   Patient would benefit from continued skilled therapy in order to address functional deficits and return to working ADL.  OBJECTIVE IMPAIRMENTS Abnormal gait, decreased activity tolerance, decreased balance, decreased endurance, decreased knowledge of condition, decreased mobility, difficulty walking, decreased ROM, decreased strength, obesity, and pain.    ACTIVITY LIMITATIONS carrying, lifting, bending, sitting, standing, squatting, sleeping, stairs, bed mobility, bathing, dressing, and locomotion level   PARTICIPATION LIMITATIONS: meal prep, cleaning, laundry, driving, community activity, and occupation   PERSONAL  FACTORS Education and Profession are also affecting patient's functional outcome.    REHAB POTENTIAL: Good   CLINICAL DECISION MAKING: Evolving/moderate complexity   EVALUATION COMPLEXITY: Moderate     GOALS: Goals reviewed with patient? No   SHORT TERM GOALS: Target date: 12/24/2021   Pt will be I and compliant with initial HEP. Baseline: Goal status: Achieved    2.  Pt will report </= 3/10 pain at baseline             Baseline:7/10 Goal status:Ongoing    LONG TERM GOALS: Target date: 05/02/2022    Pt will be independent with advanced HEP to continue to address postural limitations and muscle imbalances. Baseline:  Goal status: ongoing   2.  Pt will report </= 3/10 pain at baseline                        Baseline: Goal status: ongoing   3.  Pt will improve FOTO function score to no less than 47% as proxy for functional improvement Baseline:  Goal status: ongoing   4.  Pt will be able to run errands for her family without familiar pain.  Baseline: Unable to do anything beyond work.  Goal status: ongoing   5.  Pt will improve her MMT's to 4/5 globally.  Baseline: See chart above  Goal status: met   6.  Pt will ambulate with normal gait pattern.  Baseline:  Goal status: ongoing     PLAN: PT  FREQUENCY: 1-2x/week   PT DURATION: 4 weeks   PLANNED INTERVENTIONS: Therapeutic exercises, Therapeutic activity, Neuromuscular re-education, Balance training, Gait training, Patient/Family education, Self Care, Joint mobilization, Joint manipulation, Stair training, Aquatic Therapy, Dry Needling, Electrical stimulation, Spinal manipulation, Spinal mobilization, Cryotherapy, Moist heat, Taping, Vasopneumatic device, Traction, Ultrasound, Biofeedback, Ionotophoresis 32m/ml Dexamethasone, Manual therapy, and Re-evaluation.   PLAN FOR NEXT SESSION:  alternating land and pool; land STM, LAD, and joint mobs in addition to abdominal bracing activity, review exercises and progress as  tolerated. Consider upper body core strengthening for days when she is working more.   ADaleen BoPT, DPT 02/21/22 5:22 PM

## 2022-03-20 ENCOUNTER — Encounter (HOSPITAL_BASED_OUTPATIENT_CLINIC_OR_DEPARTMENT_OTHER): Payer: Self-pay | Admitting: Physical Therapy

## 2022-03-20 ENCOUNTER — Ambulatory Visit (HOSPITAL_BASED_OUTPATIENT_CLINIC_OR_DEPARTMENT_OTHER): Payer: BC Managed Care – PPO | Attending: Family Medicine | Admitting: Physical Therapy

## 2022-03-20 DIAGNOSIS — M79651 Pain in right thigh: Secondary | ICD-10-CM | POA: Diagnosis not present

## 2022-03-20 DIAGNOSIS — M6281 Muscle weakness (generalized): Secondary | ICD-10-CM | POA: Insufficient documentation

## 2022-03-20 DIAGNOSIS — M5459 Other low back pain: Secondary | ICD-10-CM | POA: Diagnosis not present

## 2022-03-20 DIAGNOSIS — M25551 Pain in right hip: Secondary | ICD-10-CM | POA: Insufficient documentation

## 2022-03-20 DIAGNOSIS — R2689 Other abnormalities of gait and mobility: Secondary | ICD-10-CM | POA: Insufficient documentation

## 2022-03-20 DIAGNOSIS — M79671 Pain in right foot: Secondary | ICD-10-CM | POA: Insufficient documentation

## 2022-03-20 NOTE — Therapy (Unsigned)
OUTPATIENT PHYSICAL THERAPY TREATMENT NOTE   Patient Name: Yolanda Roberson MRN: [id number] DOB:1960-04-14, 61 y.o., female Today's Date: 03/21/2022  PCP:  Caren Macadam, MD  REFERRING PROVIDER: Almedia Balls, NP  END OF SESSION:   PT End of Session - 03/20/22 1538     Visit Number 21    Number of Visits 28    Date for PT Re-Evaluation 04/21/22    Authorization Type [employer] COMM PPO    PT Start Time 1526   patient 11 min late   PT Stop Time 1600    PT Time Calculation (min) 34 min    Activity Tolerance Patient tolerated treatment well    Behavior During Therapy WFL for tasks assessed/performed                     History reviewed. No pertinent past medical history. History reviewed. No pertinent surgical history. Patient Active Problem List   Diagnosis Date Noted   Spondylolisthesis, lumbar region 12/13/2021    REFERRING DIAG: Spondylolisthesis, lumbar region [M43.16], Radiculopathy, lumbar region [M54.16], Other specified joint disorders, right hip [M25.851], Pain in right leg [M79.604]  THERAPY DIAG:  Other low back pain  Pain in right thigh  Pain in right hip  Muscle weakness (generalized)  Pain in right foot  Other abnormalities of gait and mobility  Rationale for Evaluation and Treatment Rehabilitation   PERTINENT HISTORY: None  PRECAUTIONS: None  ONSET DATE: a year ago   SUBJECTIVE:                                                                                                                                                                                            SUBJECTIVE STATEMENT: Pt reports her back and hip may be a little better but she continues to have significant pain into her lateral calf. Her back is more flaired up from walking in here today.   Eval:  Pt states that she had an insidious onset of R dorsum foot pain that started about a year ago. She states that it has progressively gotten worse resulting in lateral pain from  her foot to her R hip and into her lower back Numbness and tingling present only at the dorsum of her foot. She reports remainder of lateral pain being sharp.    PAIN:  Are you having pain? Yes: NPRS scale: 6/10   Pain location: back, radiating down R lateral leg Pain description: Sharp pain in lateral R leg, Pulling in HS Aggravating factors: Any movement.  Relieving factors: Nothing  OBJECTIVE:    DIAGNOSTIC FINDINGS:  X- ray done of lumbar spine. No interpretation  present yet.    PATIENT SURVEYS:  FOTO 41%, predicted 47% in 12 visits.   FOTO 10/16 42 pts    PALPATION: Pt tender to palpation along entire R lateral LE.  Tenderness in lateral lumbar paraspinals.  Posture: exaggerated lumbar lordosis with difficulty getting into neutral spine     LUMBAR ROM:    Active  A/PROM  10/16 AROM  11/16  Flexion WFL WFL  Extension [facility] WFL  Right lateral flexion 75% with radiating p! WFL with radiating pain  Left lateral flexion 75 with pulling  WFL with R sided stretch  Right rotation [facility]  WFL  Left rotation [facility]  WFL   (Blank rows = not tested)   LOWER EXTREMITY ROM:      Active  Right eval Left eval  Hip flexion [facility]  Hip extension      Hip abduction      Hip adduction      Hip internal rotation [facility]  Hip external rotation [facility]  Knee flexion [facility]  Knee extension WFL WFL   (Blank rows = not tested)   LOWER EXTREMITY MMT:     MMT Right 10/16 11/16 Left eval  Hip flexion 4/5 4+ 4/5  Hip extension       Hip abduction       Hip adduction       Hip internal rotation       Hip external rotation       Knee flexion 4/5 4+ 4+/5  Knee extension 5/_0 (Blank rows = not tested)   GAIT: Distance walked: Within clinic (7f)  Assistive device utilized: None Level of assistance: Complete Independence Comments: Decreased R stance time, exaggerated lumbar lordosis     TODAY'S TREATMENT  12/13 LTR x10  Piriformis stretch 3x20 sec hold   Row 2x10  red  Shoulder extension 2x10 red              Supine march 2x10  Supine clam shell 2x15 red  Supine bridge 2x10 red   Ball roll x10 5 sec hold bilateral   11/16  STM of lumbar paraspinals and UPA of L1-5 grade III  R LE LAD/manual traction 5 min  Child's pose 5s 10x Child's pose with SB 3x each way 5s hold Supine SKTC 5s 10x    11/14  Pt seen for aquatic therapy today.  Treatment took place in water 3.25-4.5 ft in depth at the [facility] Temp of water was 93.  Pt entered/exited the pool via stairs with supervision with bilat rail.   * UE supported 1 foam hand buoy:   forward walking, backward walking, cues for step length * side stepping holding yellow hand floats R/L  *L stretch on wall 3x30s hold, 4th reps with hip hiking R/L;  * Seated : sciatic nerve slide 2x10; figure 4 stretch:  cycling, add/abd 2x20-25 *seated on squoodle hands on wall: posterior pelvic tilt *Standing ue support on wall: QL stretching R/L *kick board push pull le staggered R/L leading then feet together for core engagement. Cues for abdominal bracing and glue isometric hold *1/2 noodle pull down 2x 10 cues for abd bracing *cycling on yellow noodle holding to corner of pool    last 11/10 Trigger Point Dry-Needling  Treatment instructions: Expect mild to moderate muscle soreness. S/S of pneumothorax if dry needled over a lung field, and to seek immediate medical attention should they occur. Patient verbalized understanding of these instructions and  education.   Patient Consent Given: Yes Education handout provided: Previously provided Muscles treated: right glut min in 3 spots Electrical stimulation performed: No Parameters:    Treatment response/outcome: Great twitch    Manual: trigger point release to glut min and medius n both sides; skilled pallationof trigger points. LAD with grade II and III ocillations on the right grade III and IV oscillations on the left.   Supine LTR  x10  Gluteal stretch 3x20 sec hold bilateral   Supine march 2x10  Supine hip abdcution 2x10 red   Supine bridge x10         PATIENT EDUCATION:  Education details:exercise progression, exam results, anatomy, centralization of symptoms, avoiding agg factors Person educated: Patient Education method: Explanation and Demonstration Education comprehension: verbalized understanding and returned demonstration     HOME EXERCISE PROGRAM: Access Code: [email or web address] URL: [email or web address] Date: 11/26/2021 Prepared by: Rudi Heap   ASSESSMENT:   CLINICAL IMPRESSION: At this point the patients pain level has changed very little. She continues to have radicular pain into her lower leg. It has not changed. We will focus on exercises over the next few visits. She requested needling. At this point we have done several sessions of needling with no major carryover. The exercises are something she can continue on discharge. We will continue to work on technique and expanding her exercise program. She had mild pain with exercises today.   OBJECTIVE IMPAIRMENTS Abnormal gait, decreased activity tolerance, decreased balance, decreased endurance, decreased knowledge of condition, decreased mobility, difficulty walking, decreased ROM, decreased strength, obesity, and pain.    ACTIVITY LIMITATIONS carrying, lifting, bending, sitting, standing, squatting, sleeping, stairs, bed mobility, bathing, dressing, and locomotion level   PARTICIPATION LIMITATIONS: meal prep, cleaning, laundry, driving, community activity, and occupation   PERSONAL FACTORS Education and Profession are also affecting patient's functional outcome.    REHAB POTENTIAL: Good   CLINICAL DECISION MAKING: Evolving/moderate complexity   EVALUATION COMPLEXITY: Moderate     GOALS: Goals reviewed with patient? No   SHORT TERM GOALS: Target date: 12/24/2021   Pt will be I and compliant with initial HEP. Baseline: Goal  status: Achieved    2.  Pt will report </= 3/10 pain at baseline             Baseline:7/10 Goal status:Ongoing    LONG TERM GOALS: Target date: 05/02/2022    Pt will be independent with advanced HEP to continue to address postural limitations and muscle imbalances. Baseline:  Goal status: ongoing   2.  Pt will report </= 3/10 pain at baseline                        Baseline: Goal status: ongoing   3.  Pt will improve FOTO function score to no less than 47% as proxy for functional improvement Baseline:  Goal status: ongoing   4.  Pt will be able to run errands for her family without familiar pain.  Baseline: Unable to do anything beyond work.  Goal status: ongoing   5.  Pt will improve her MMT's to 4/5 globally.  Baseline: See chart above  Goal status: met   6.  Pt will ambulate with normal gait pattern.  Baseline:  Goal status: ongoing     PLAN: PT FREQUENCY: 1-2x/week   PT DURATION: 4 weeks   PLANNED INTERVENTIONS: Therapeutic exercises, Therapeutic activity, Neuromuscular re-education, Balance training, Gait training, Patient/Family education, Self Care, Joint mobilization, Joint manipulation, Stair training, Aquatic Therapy, Dry  Needling, Electrical stimulation, Spinal manipulation, Spinal mobilization, Cryotherapy, Moist heat, Taping, Vasopneumatic device, Traction, Ultrasound, Biofeedback, Ionotophoresis 40m/ml Dexamethasone, Manual therapy, and Re-evaluation.   PLAN FOR NEXT SESSION:  alternating land and pool; land STM, LAD, and joint mobs in addition to abdominal bracing activity, review exercises and progress as tolerated. Consider upper body core strengthening for days when she is working more.   ADaleen BoPT, DPT 03/21/22 8:11 AM

## 2022-03-27 ENCOUNTER — Encounter (HOSPITAL_BASED_OUTPATIENT_CLINIC_OR_DEPARTMENT_OTHER): Payer: Self-pay | Admitting: Physical Therapy

## 2022-03-27 ENCOUNTER — Ambulatory Visit (HOSPITAL_BASED_OUTPATIENT_CLINIC_OR_DEPARTMENT_OTHER): Payer: BC Managed Care – PPO | Admitting: Physical Therapy

## 2022-03-27 DIAGNOSIS — R2689 Other abnormalities of gait and mobility: Secondary | ICD-10-CM

## 2022-03-27 DIAGNOSIS — M5459 Other low back pain: Secondary | ICD-10-CM

## 2022-03-27 DIAGNOSIS — M79651 Pain in right thigh: Secondary | ICD-10-CM | POA: Diagnosis not present

## 2022-03-27 DIAGNOSIS — M79671 Pain in right foot: Secondary | ICD-10-CM | POA: Diagnosis not present

## 2022-03-27 DIAGNOSIS — M25551 Pain in right hip: Secondary | ICD-10-CM | POA: Diagnosis not present

## 2022-03-27 DIAGNOSIS — M6281 Muscle weakness (generalized): Secondary | ICD-10-CM

## 2022-03-28 ENCOUNTER — Encounter (HOSPITAL_BASED_OUTPATIENT_CLINIC_OR_DEPARTMENT_OTHER): Payer: Self-pay | Admitting: Physical Therapy

## 2022-03-28 NOTE — Therapy (Addendum)
OUTPATIENT PHYSICAL THERAPY TREATMENT NOTE   Patient Name: Yolanda Roberson MRN: [id number] DOB:09-Dec-1960, 61 y.o., female Today's Date: 04/30/2022  PCP:  Caren Macadam, MD  REFERRING PROVIDER: Almedia Balls, NP  END OF SESSION:             History reviewed. No pertinent past medical history. History reviewed. No pertinent surgical history. Patient Active Problem List   Diagnosis Date Noted   Spondylolisthesis, lumbar region 12/13/2021    REFERRING DIAG: Spondylolisthesis, lumbar region [M43.16], Radiculopathy, lumbar region [M54.16], Other specified joint disorders, right hip [M25.851], Pain in right leg [M79.604]  THERAPY DIAG:  Other low back pain  Pain in right thigh  Pain in right hip  Muscle weakness (generalized)  Pain in right foot  Other abnormalities of gait and mobility  Rationale for Evaluation and Treatment Rehabilitation   PERTINENT HISTORY: None  PRECAUTIONS: None  ONSET DATE: a year ago   SUBJECTIVE:                                                                                                                                                                                            SUBJECTIVE STATEMENT: Pt reports her back and hip may be a little better but she continues to have significant pain into her lateral calf. Her back is more flaired up from walking in here today.   Eval:  Pt states that she had an insidious onset of R dorsum foot pain that started about a year ago. She states that it has progressively gotten worse resulting in lateral pain from her foot to her R hip and into her lower back Numbness and tingling present only at the dorsum of her foot. She reports remainder of lateral pain being sharp.    PAIN:  Are you having pain? Yes: NPRS scale: 6/10   Pain location: back, radiating down R lateral leg Pain description: Sharp pain in lateral R leg, Pulling in HS Aggravating factors: Any movement.  Relieving factors:  Nothing  OBJECTIVE:    DIAGNOSTIC FINDINGS:  X- ray done of lumbar spine. No interpretation present yet.    PATIENT SURVEYS:  FOTO 41%, predicted 47% in 12 visits.   FOTO 10/16 42 pts    PALPATION: Pt tender to palpation along entire R lateral LE.  Tenderness in lateral lumbar paraspinals.  Posture: exaggerated lumbar lordosis with difficulty getting into neutral spine     LUMBAR ROM:    Active  A/PROM  10/16 AROM  11/16  Flexion WFL WFL  Extension [facility] WFL  Right lateral flexion 75% with radiating p! WFL with radiating pain  Left lateral flexion 75 with  pulling  WFL with R sided stretch  Right rotation [facility]  WFL  Left rotation [facility]  WFL   (Blank rows = not tested)   LOWER EXTREMITY ROM:      Active  Right eval Left eval  Hip flexion [facility]  Hip extension      Hip abduction      Hip adduction      Hip internal rotation [facility]  Hip external rotation [facility]  Knee flexion [facility]  Knee extension WFL WFL   (Blank rows = not tested)   LOWER EXTREMITY MMT:     MMT Right 10/16 11/16 Left eval  Hip flexion 4/5 4+ 4/5  Hip extension       Hip abduction       Hip adduction       Hip internal rotation       Hip external rotation       Knee flexion 4/5 4+ 4+/5  Knee extension 5/_0 (Blank rows = not tested)   GAIT: Distance walked: Within clinic (54f)  Assistive device utilized: None Level of assistance: Complete Independence Comments: Decreased R stance time, exaggerated lumbar lordosis     TODAY'S TREATMENT  12/13 LTR x10  Piriformis stretch 3x20 sec hold   Row 2x10 red  Shoulder extension 2x10 red              Supine march 2x10  Supine clam shell 2x15 red  Supine bridge 2x10 red   Ball roll x10 5 sec hold bilateral   11/16  STM of lumbar paraspinals and UPA of L1-5 grade III  R LE LAD/manual traction 5 min  Child's pose 5s 10x Child's pose with SB 3x each way 5s hold Supine SKTC 5s 10x    11/14  Pt seen for aquatic therapy  today.  Treatment took place in water 3.25-4.5 ft in [place] at [facility] Temp of water was 93.  Pt entered/exited the pool via stairs with supervision with bilat rail.   * UE supported 1 foam hand buoy:   forward walking, backward walking, cues for step length * side stepping holding yellow hand floats R/L  *L stretch on wall 3x30s hold, 4th reps with hip hiking R/L;  * Seated : sciatic nerve slide 2x10; figure 4 stretch:  cycling, add/abd 2x20-25 *seated on squoodle hands on wall: posterior pelvic tilt *Standing ue support on wall: QL stretching R/L *kick board push pull le staggered R/L leading then feet together for core engagement. Cues for abdominal bracing and glue isometric hold *1/2 noodle pull down 2x 10 cues for abd bracing *cycling on yellow noodle holding to corner of pool    last 11/10 Trigger Point Dry-Needling  Treatment instructions: Expect mild to moderate muscle soreness. S/S of pneumothorax if dry needled over a lung field, and to seek immediate medical attention should they occur. Patient verbalized understanding of these instructions and education.   Patient Consent Given: Yes Education handout provided: Previously provided Muscles treated: right glut min in 3 spots Electrical stimulation performed: No Parameters:    Treatment response/outcome: Great twitch    Manual: trigger point release to glut min and medius n both sides; skilled pallationof trigger points. LAD with grade II and III ocillations on the right grade III and IV oscillations on the left.   Supine LTR x10  Gluteal stretch 3x20 sec hold bilateral   Supine march 2x10  Supine hip abdcution 2x10 red  Supine bridge x10         PATIENT EDUCATION:  Education details:exercise progression, exam results, anatomy, centralization of symptoms, avoiding agg factors Person educated: Patient Education method: Customer service manager Education comprehension: verbalized  understanding and returned demonstration     HOME EXERCISE PROGRAM: Access Code: [email or web address] URL: [email or web address] Date: 11/26/2021 Prepared by: Rudi Heap   ASSESSMENT:   CLINICAL IMPRESSION: At this point the patients pain level has changed very little. She continues to have radicular pain into her lower leg. It has not changed. We will focus on exercises over the next few visits. She requested needling. At this point we have done several sessions of needling with no major carryover. The exercises are something she can continue on discharge. We will continue to work on technique and expanding her exercise program. She had mild pain with exercises today.   OBJECTIVE IMPAIRMENTS Abnormal gait, decreased activity tolerance, decreased balance, decreased endurance, decreased knowledge of condition, decreased mobility, difficulty walking, decreased ROM, decreased strength, obesity, and pain.    ACTIVITY LIMITATIONS carrying, lifting, bending, sitting, standing, squatting, sleeping, stairs, bed mobility, bathing, dressing, and locomotion level   PARTICIPATION LIMITATIONS: meal prep, cleaning, laundry, driving, community activity, and occupation   PERSONAL FACTORS Education and Profession are also affecting patient's functional outcome.    REHAB POTENTIAL: Good   CLINICAL DECISION MAKING: Evolving/moderate complexity   EVALUATION COMPLEXITY: Moderate     GOALS: Goals reviewed with patient? No   SHORT TERM GOALS: Target date: 12/24/2021   Pt will be I and compliant with initial HEP. Baseline: Goal status: Achieved    2.  Pt will report </= 3/10 pain at baseline             Baseline:7/10 Goal status:Ongoing    LONG TERM GOALS: Target date: 05/02/2022    Pt will be independent with advanced HEP to continue to address postural limitations and muscle imbalances. Baseline:  Goal status: ongoing   2.  Pt will report </= 3/10 pain at baseline                         Baseline: Goal status: ongoing   3.  Pt will improve FOTO function score to no less than 47% as proxy for functional improvement Baseline:  Goal status: ongoing   4.  Pt will be able to run errands for her family without familiar pain.  Baseline: Unable to do anything beyond work.  Goal status: ongoing   5.  Pt will improve her MMT's to 4/5 globally.  Baseline: See chart above  Goal status: met   6.  Pt will ambulate with normal gait pattern.  Baseline:  Goal status: ongoing     PLAN: PT FREQUENCY: 1-2x/week   PT DURATION: 4 weeks   PLANNED INTERVENTIONS: Therapeutic exercises, Therapeutic activity, Neuromuscular re-education, Balance training, Gait training, Patient/Family education, Self Care, Joint mobilization, Joint manipulation, Stair training, Aquatic Therapy, Dry Needling, Electrical stimulation, Spinal manipulation, Spinal mobilization, Cryotherapy, Moist heat, Taping, Vasopneumatic device, Traction, Ultrasound, Biofeedback, Ionotophoresis 23m/ml Dexamethasone, Manual therapy, and Re-evaluation.   PLAN FOR NEXT SESSION:  alternating land and pool; land STM, LAD, and joint mobs in addition to abdominal bracing activity, review exercises and progress as tolerated. Consider upper body core strengthening for days when she is working more.   DCarolyne LittlesPT DPT  04/30/22 9:26 AM      OUTPATIENT PHYSICAL THERAPY TREATMENT NOTE/Discharge    Patient Name: ERanya FiddlerMRN: [id number]:[birth year]  61 y.o., female 71 Date: 03/21/2022  PCP:  Caren Macadam, MD  REFERRING PROVIDER: Almedia Balls, NP  END OF SESSION:   PT End of Session - 03/20/22 1538     Visit Number 21    Number of Visits 28    Date for PT Re-Evaluation 04/21/22    Authorization Type [employer] COMM PPO    PT Start Time 1526   patient 11 min late   PT Stop Time 1600    PT Time Calculation (min) 34 min    Activity Tolerance Patient tolerated treatment well    Behavior During Therapy WFL for  tasks assessed/performed                     History reviewed. No pertinent past medical history. History reviewed. No pertinent surgical history. Patient Active Problem List   Diagnosis Date Noted   Spondylolisthesis, lumbar region 12/13/2021    REFERRING DIAG: Spondylolisthesis, lumbar region [M43.16], Radiculopathy, lumbar region [M54.16], Other specified joint disorders, right hip [M25.851], Pain in right leg [M79.604]  THERAPY DIAG:  Other low back pain  Pain in right thigh  Pain in right hip  Muscle weakness (generalized)  Pain in right foot  Other abnormalities of gait and mobility  Rationale for Evaluation and Treatment Rehabilitation   PERTINENT HISTORY: None  PRECAUTIONS: None  ONSET DATE: a year ago   SUBJECTIVE:                                                                                                                                                                                            SUBJECTIVE STATEMENT: The patient continues to report high levels of pain. Sh reports her hip is sore today.  Eval:  Pt states that she had an insidious onset of R dorsum foot pain that started about a year ago. She states that it has progressively gotten worse resulting in lateral pain from her foot to her R hip and into her lower back Numbness and tingling present only at the dorsum of her foot. She reports remainder of lateral pain being sharp.    PAIN:  Are you having pain? Yes: NPRS scale: 6/10  12/20 Pain location: back, radiating down R lateral leg Pain description: Sharp pain in lateral R leg, Pulling in HS Aggravating factors: Any movement.  Relieving factors: Nothing  OBJECTIVE:    DIAGNOSTIC FINDINGS:  X- ray done of lumbar spine. No interpretation present yet.    PATIENT SURVEYS:  FOTO 41%, predicted 47% in 12 visits.   FOTO 10/16 42 pts    PALPATION: Pt tender to palpation along entire R  lateral LE.  Tenderness in lateral  lumbar paraspinals.  Posture: exaggerated lumbar lordosis with difficulty getting into neutral spine     LUMBAR ROM:    Active  A/PROM  10/16 AROM  11/16  Flexion WFL WFL  Extension [facility] WFL  Right lateral flexion 75% with radiating p! WFL with radiating pain  Left lateral flexion 75 with pulling  WFL with R sided stretch  Right rotation [facility]  WFL  Left rotation [facility]  WFL   (Blank rows = not tested)   LOWER EXTREMITY ROM:      Active  Right eval Left eval  Hip flexion [facility]  Hip extension      Hip abduction      Hip adduction      Hip internal rotation [facility]  Hip external rotation [facility]  Knee flexion [facility]  Knee extension WFL WFL   (Blank rows = not tested)   LOWER EXTREMITY MMT:     MMT Right 10/16 11/16 Left eval  Hip flexion 4/5 4+ 4/5  Hip extension       Hip abduction       Hip adduction       Hip internal rotation       Hip external rotation       Knee flexion 4/5 4+ 4+/5  Knee extension 5/_0 (Blank rows = not tested)   GAIT: Distance walked: Within clinic (5f)  Assistive device utilized: None Level of assistance: Complete Independence Comments: Decreased R stance time, exaggerated lumbar lordosis     TODAY'S TREATMENT  12/21 Trigger Point Dry-Needling  Treatment instructions: Expect mild to moderate muscle soreness. S/S of pneumothorax if dry needled over a lung field, and to seek immediate medical attention should they occur. Patient verbalized understanding of these instructions and education.   Patient Consent Given: Yes Education handout provided: Previously provided Muscles treated: right glut min in 3 spots Electrical stimulation performed: No Parameters:    Treatment response/outcome: Great twitch    Manual: trigger point release to glut min and medius n both sides; skilled pallationof trigger points. LAD with grade II and III ocillations on the right grade III and IV oscillations on the left.  Standing hip 3 way 2x15   Reviewed complete HEP and how to use it   Ball roll x10 5 sec hold  Lateral ball roll x5 5 sec hold each side    12/13 LTR x10  Piriformis stretch 3x20 sec hold   Row 2x10 red  Shoulder extension 2x10 red              Supine march 2x10  Supine clam shell 2x15 red  Supine bridge 2x10 red   Ball roll x10 5 sec hold bilateral   11/16  STM of lumbar paraspinals and UPA of L1-5 grade III  R LE LAD/manual traction 5 min  Child's pose 5s 10x Child's pose with SB 3x each way 5s hold Supine SKTC 5s 10x    11/14  Pt seen for aquatic therapy today.  Treatment took [place] of [place] was 93.  Pt entered/exited the pool via stairs with supervision with bilat rail.   * UE supported 1 foam hand buoy:   forward walking, backward walking, cues for step length * side stepping holding yellow hand floats R/L  *L stretch on wall 3x30s hold, 4th reps with hip hiking R/L;  * Seated :  sciatic nerve slide 2x10; figure 4 stretch:  cycling, add/abd 2x20-25 *seated on squoodle hands on wall: posterior pelvic tilt *Standing ue support on wall: QL stretching R/L *kick board push pull le staggered R/L leading then feet together for core engagement. Cues for abdominal bracing and glue isometric hold *1/2 noodle pull down 2x 10 cues for abd bracing *cycling on yellow noodle holding to corner of pool    last 11/10 Trigger Point Dry-Needling  Treatment instructions: Expect mild to moderate muscle soreness. S/S of pneumothorax if dry needled over a lung field, and to seek immediate medical attention should they occur. Patient verbalized understanding of these instructions and education.   Patient Consent Given: Yes Education handout provided: Previously provided Muscles treated: right glut min in 3 spots Electrical stimulation performed: No Parameters:    Treatment response/outcome: Great twitch    Manual: trigger point release  to glut min and medius n both sides; skilled pallationof trigger points. LAD with grade II and III ocillations on the right grade III and IV oscillations on the left.   Supine LTR x10  Gluteal stretch 3x20 sec hold bilateral   Supine march 2x10  Supine hip abdcution 2x10 red   Supine bridge x10         PATIENT EDUCATION:  Education details:exercise progression, exam results, anatomy, centralization of symptoms, avoiding agg factors Person educated: Patient Education method: Explanation and Demonstration Education comprehension: verbalized understanding and returned demonstration     HOME EXERCISE PROGRAM: Access Code: [email or web address] URL: [email or web address] Date: 11/26/2021 Prepared by: Rudi Heap   ASSESSMENT:   CLINICAL IMPRESSION: The patient has reached max benefit from therapy. She has had very little change in pain and she is still having significant radicular pain into her lateral calf. We have tried needling, soft tissue mobilization, and exercise on land and water with very little improvement. She was advised to return to her MD at this time. See below for goal specific progress.  OBJECTIVE IMPAIRMENTS Abnormal gait, decreased activity tolerance, decreased balance, decreased endurance, decreased knowledge of condition, decreased mobility, difficulty walking, decreased ROM, decreased strength, obesity, and pain.    ACTIVITY LIMITATIONS carrying, lifting, bending, sitting, standing, squatting, sleeping, stairs, bed mobility, bathing, dressing, and locomotion level   PARTICIPATION LIMITATIONS: meal prep, cleaning, laundry, driving, community activity, and occupation   PERSONAL FACTORS Education and Profession are also affecting patient's functional outcome.    REHAB POTENTIAL: Good   CLINICAL DECISION MAKING: Evolving/moderate complexity   EVALUATION COMPLEXITY: Moderate     GOALS: Goals reviewed with patient? No   SHORT TERM GOALS: Target date:  12/24/2021   Pt will be I and compliant with initial HEP. Baseline: Goal status: Achieved    2.  Pt will report </= 3/10 pain at baseline             Baseline:7/10 Goal status: not met 12/20   LONG TERM GOALS: Target date: 05/02/2022    Pt will be independent with advanced HEP to continue to address postural limitations and muscle imbalances. Baseline:  Goal status: not met 12/20  2.  Pt will report </= 3/10 pain at baseline                        Baseline: Goal status: not met 12/20    3.  Pt will improve FOTO function score to no less than 47% as proxy for functional improvement Baseline:  Goal status: not met 12/20    4.  Pt will  be able to run errands for her family without familiar pain.  Baseline: Unable to do anything beyond work.  Goal status not met    5.  Pt will improve her MMT's to 4/5 globally.  Baseline: See chart above  Goal status: met    6.  Pt will ambulate with normal gait pattern.  Baseline:  Goal status: not met      PLAN: PT FREQUENCY: 1-2x/week   PT DURATION: 4 weeks   PLANNED INTERVENTIONS: Therapeutic exercises, Therapeutic activity, Neuromuscular re-education, Balance training, Gait training, Patient/Family education, Self Care, Joint mobilization, Joint manipulation, Stair training, Aquatic Therapy, Dry Needling, Electrical stimulation, Spinal manipulation, Spinal mobilization, Cryotherapy, Moist heat, Taping, Vasopneumatic device, Traction, Ultrasound, Biofeedback, Ionotophoresis 27m/ml Dexamethasone, Manual therapy, and Re-evaluation.   PLAN FOR NEXT SESSION:  alternating land and pool; land STM, LAD, and joint mobs in addition to abdominal bracing activity, review exercises and progress as tolerated. Consider upper body core strengthening for days when she is working more.   DCarolyne LittlesPT DPT 03/21/22 8:11 AM

## 2022-03-29 ENCOUNTER — Ambulatory Visit (HOSPITAL_BASED_OUTPATIENT_CLINIC_OR_DEPARTMENT_OTHER): Payer: BC Managed Care – PPO | Admitting: Physical Therapy

## 2022-04-03 ENCOUNTER — Encounter (HOSPITAL_BASED_OUTPATIENT_CLINIC_OR_DEPARTMENT_OTHER): Payer: BC Managed Care – PPO | Admitting: Physical Therapy

## 2022-04-05 ENCOUNTER — Ambulatory Visit (HOSPITAL_BASED_OUTPATIENT_CLINIC_OR_DEPARTMENT_OTHER): Payer: BC Managed Care – PPO | Admitting: Physical Therapy

## 2022-05-23 IMAGING — CR DG HIP (WITH OR WITHOUT PELVIS) 2-3V*R*
2 series · 2 of 2 positions shown · non-contrast
Comparison: None.

CLINICAL DATA: Right hip pain.

EXAM:
DG HIP (WITH OR WITHOUT PELVIS) 2-3V RIGHT

[w pelvis * (1 of 2)]
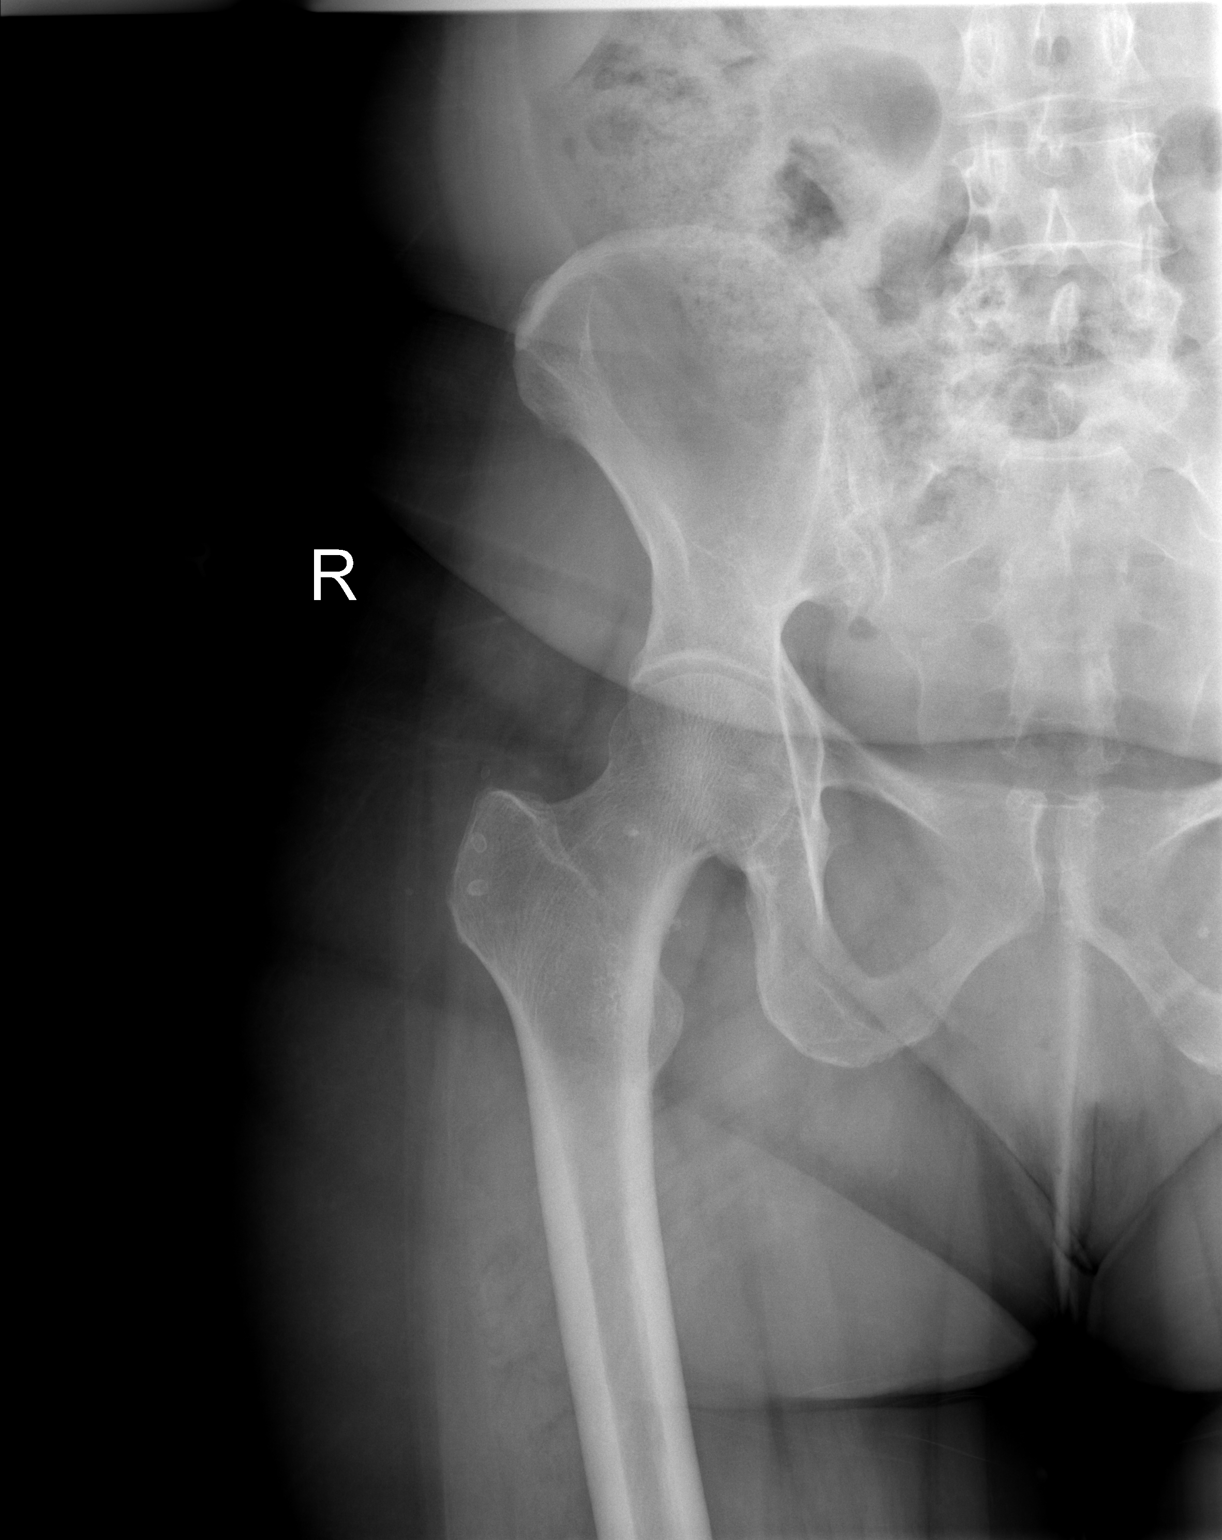

[w pelvis * (2 of 2)]
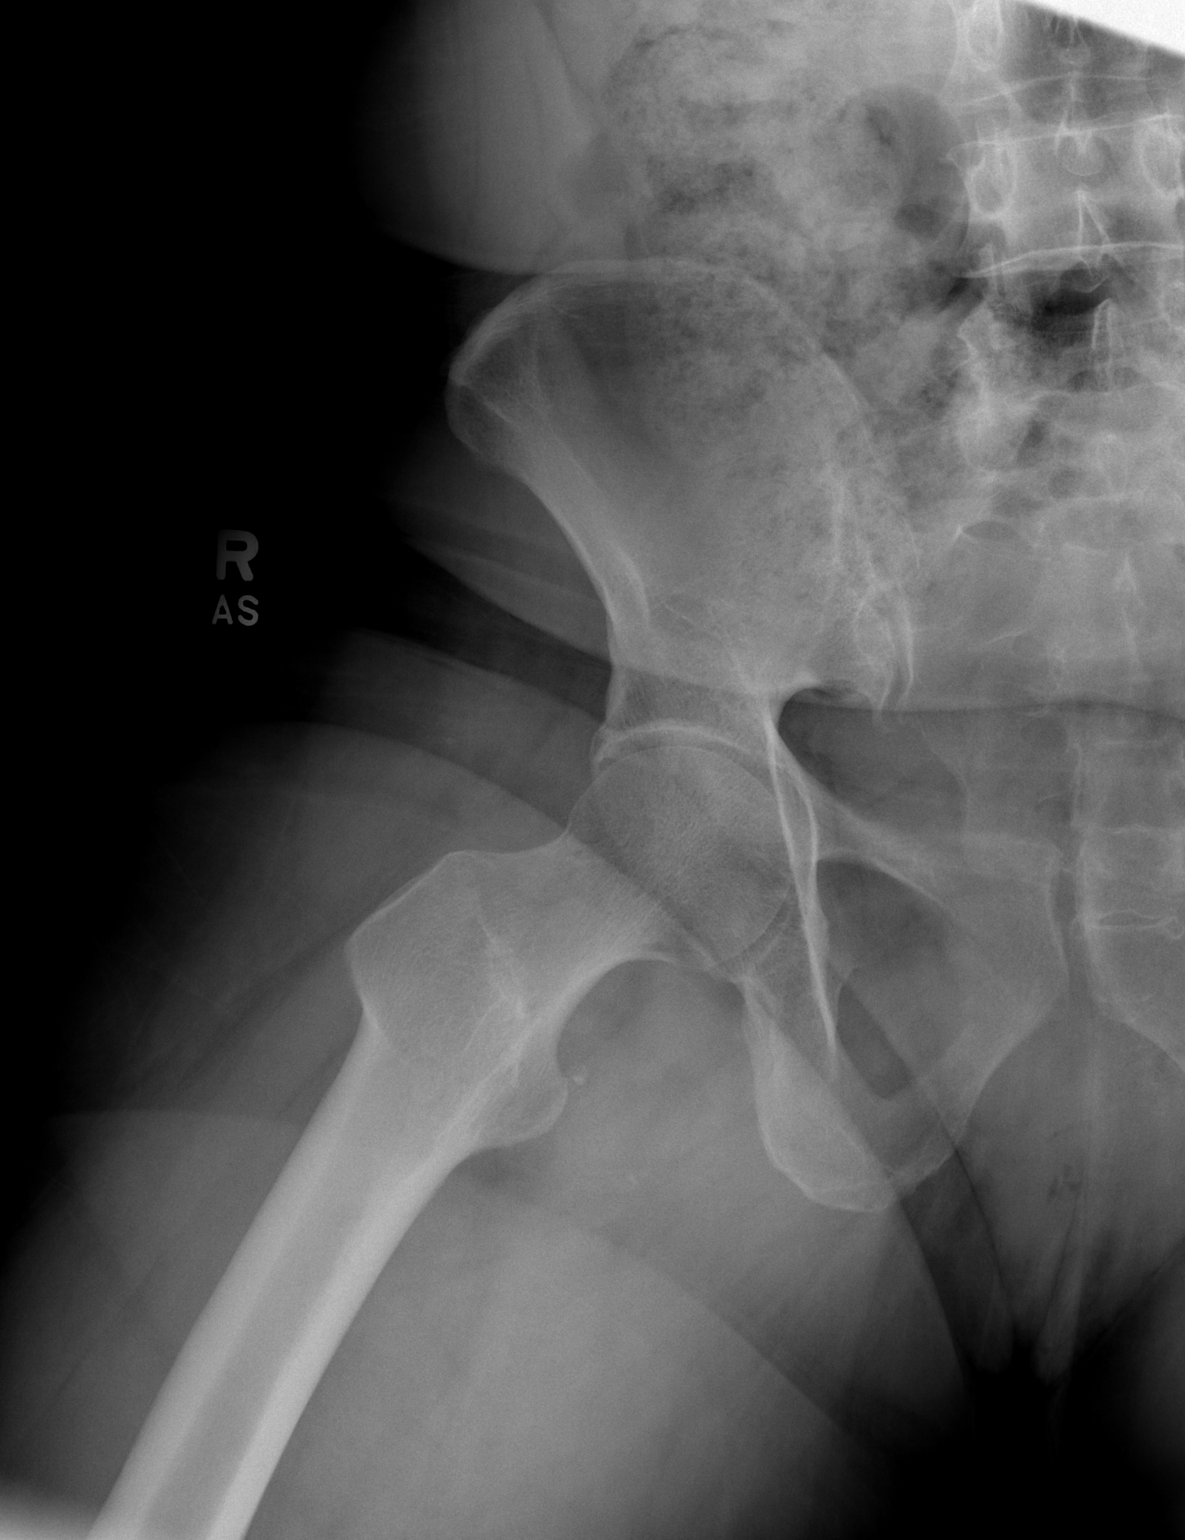

[2 of 2 positions shown; findings below may reference images not displayed]

FINDINGS: There is no evidence of hip fracture or dislocation. There is no
evidence of arthropathy or other focal bone abnormality.
IMPRESSION: Negative.

## 2022-05-23 IMAGING — CR DG LUMBAR SPINE 2-3V
3 series · 3 of 3 positions shown · non-contrast
Comparison: Lumbar spine x-ray 03/13/2009.

CLINICAL DATA: Lumbar pain.  No injury.

EXAM:
LUMBAR SPINE - 2-3 VIEW

[w l-spine a.p. *]
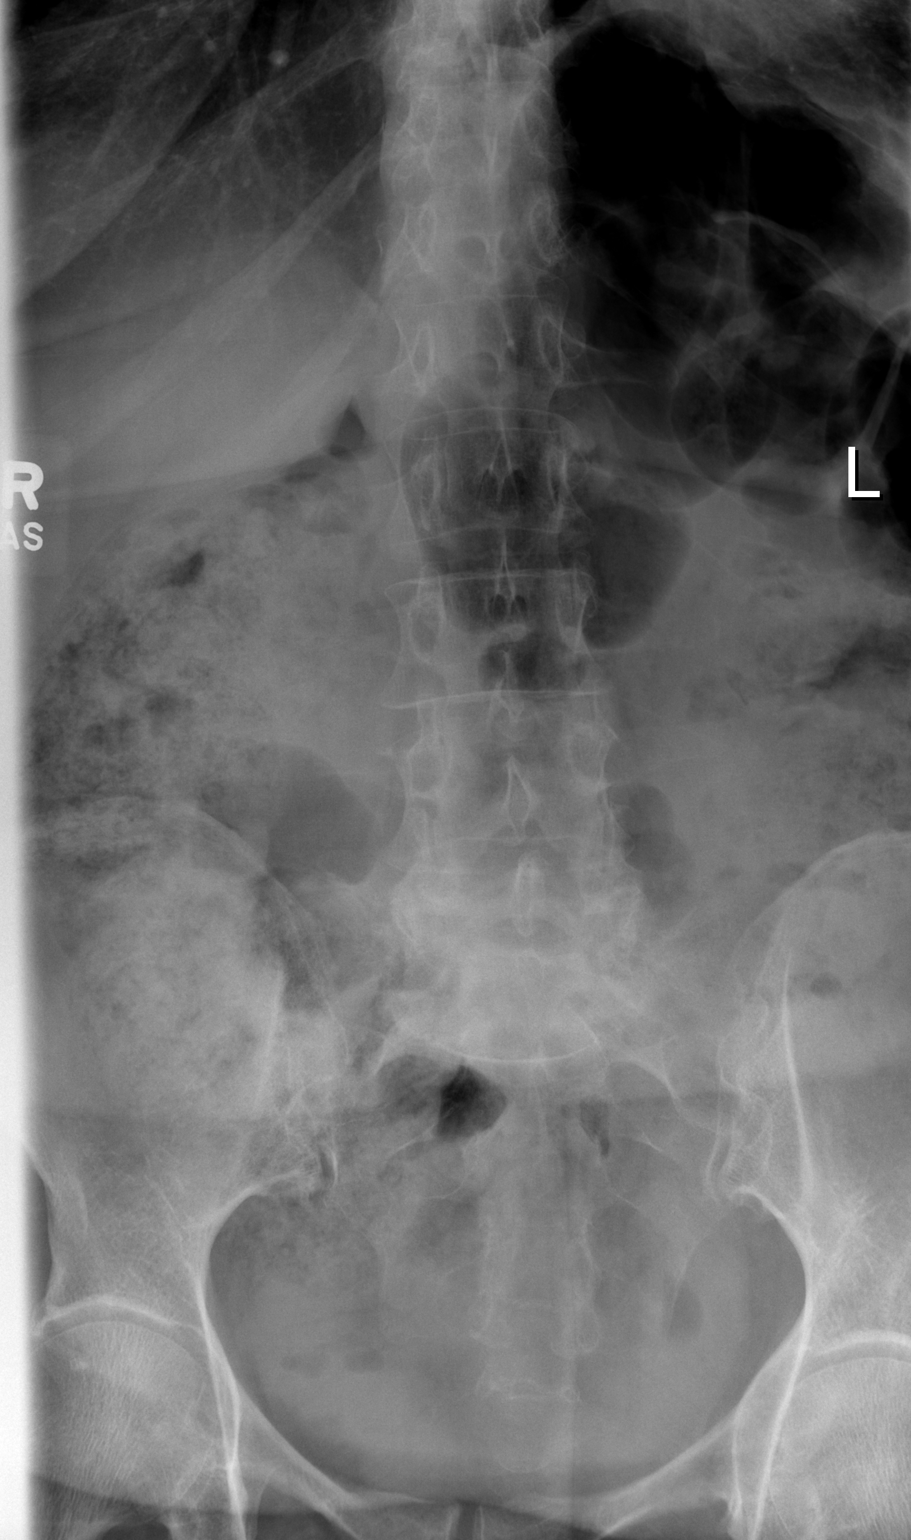

[w l-spine lat * (1 of 2)]
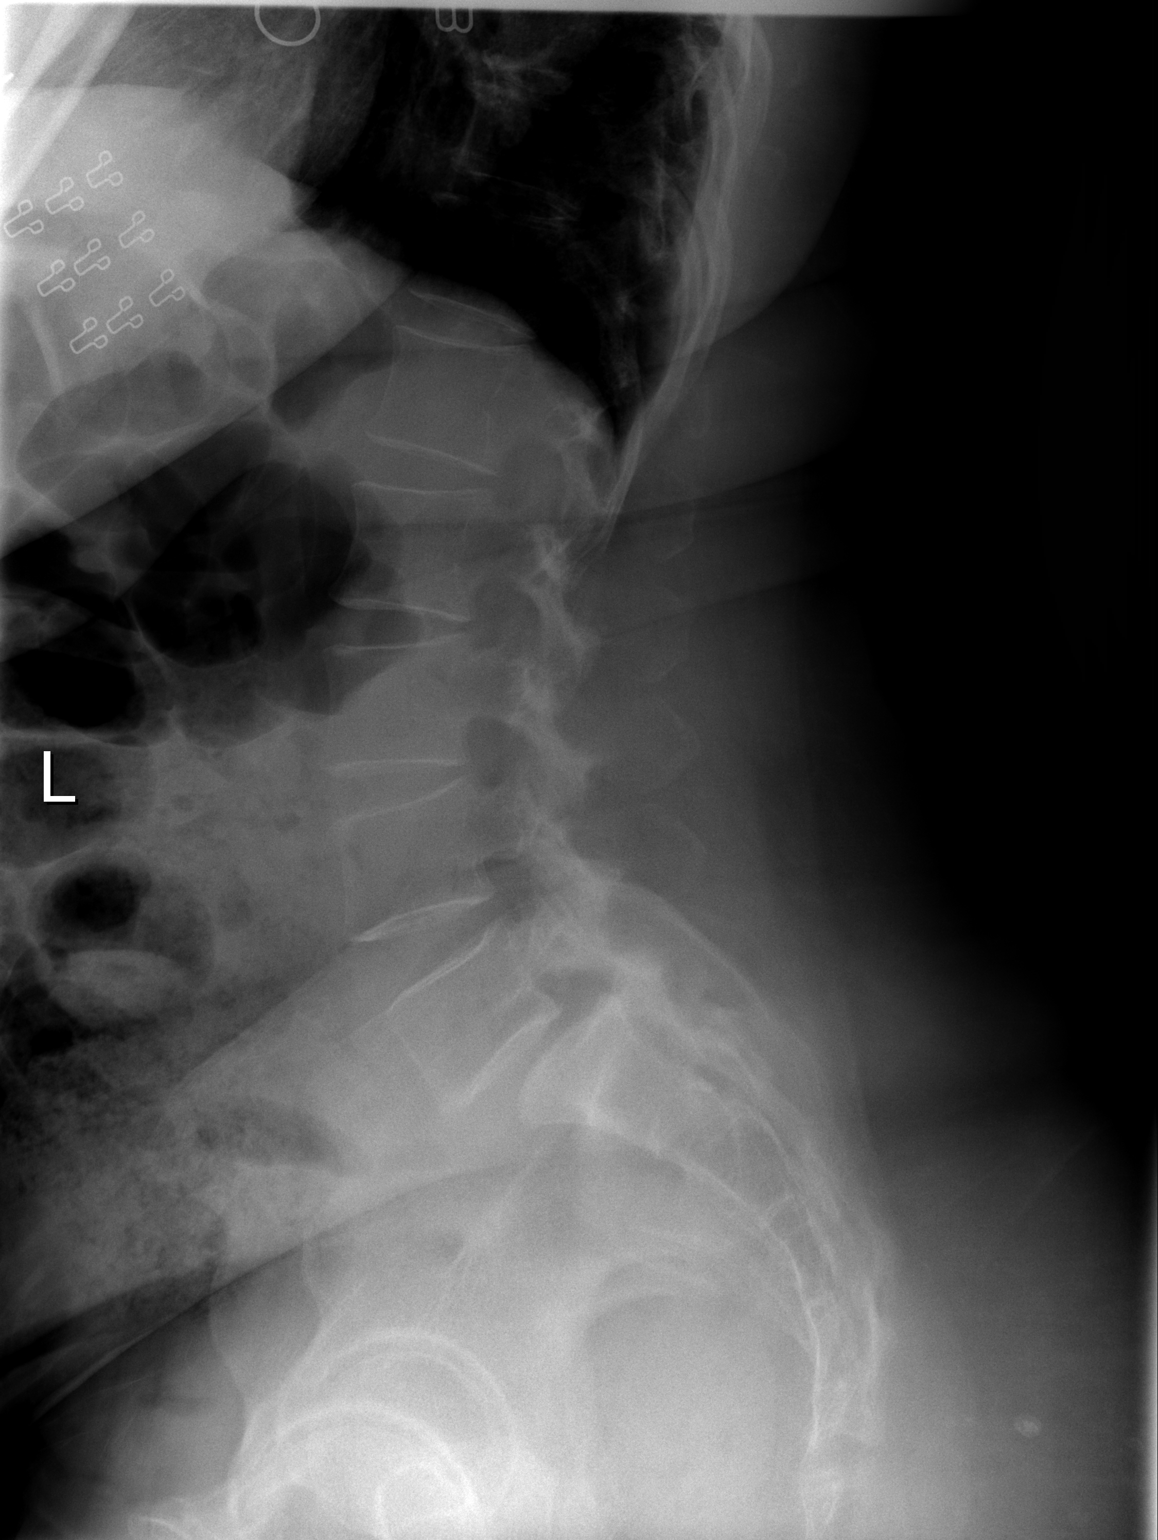

[w l-spine lat * (2 of 2)]
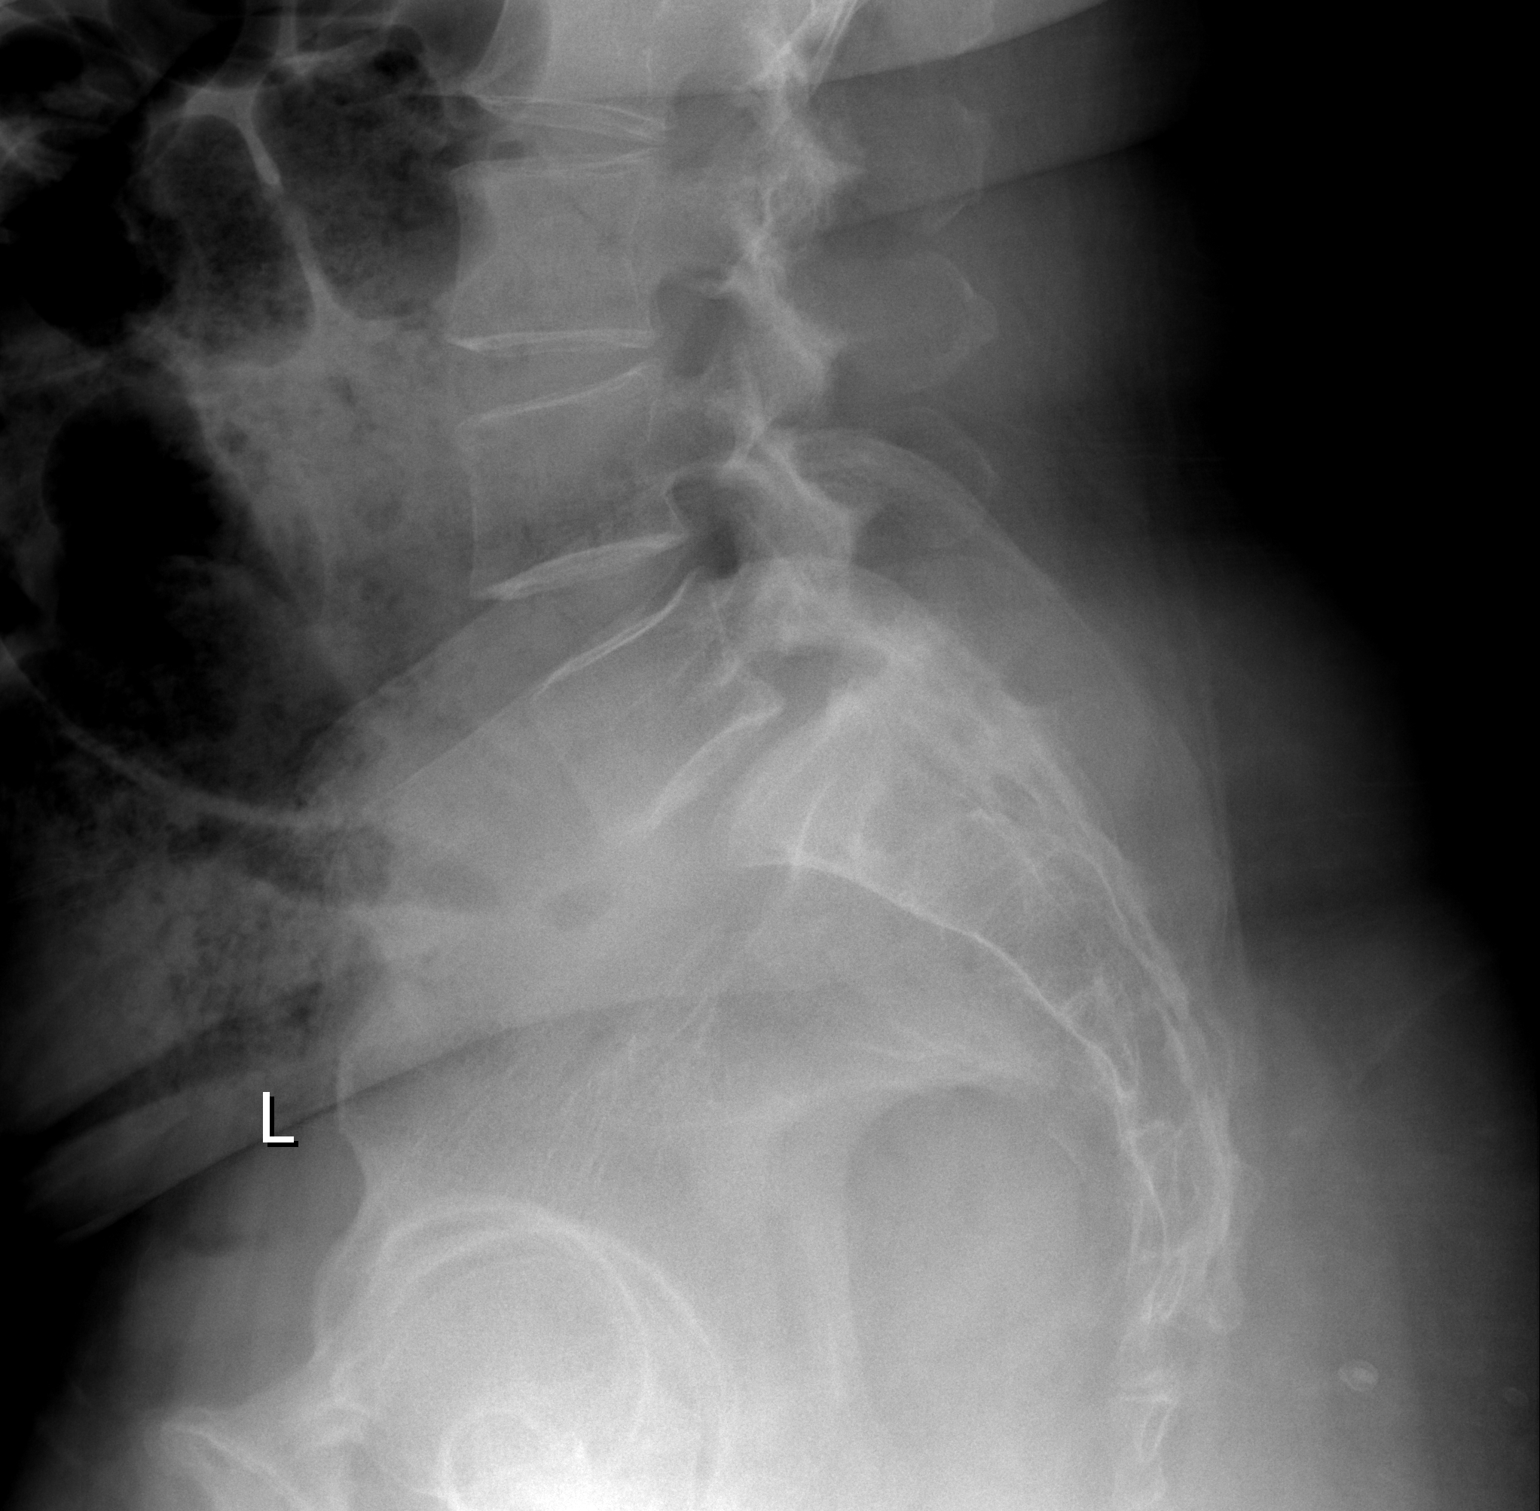

[3 of 3 positions shown; findings below may reference images not displayed]

FINDINGS: CT abdomen and pelvis 06/28/2009. There is new 7 mm of
anterolisthesis at L5-S1. There is mild degenerative narrowing at
L5-S1 which is also new. Alignment is otherwise anatomic. No acute
fractures are seen. Vertebral body heights are well maintained. Soft
tissues are within normal limits.
IMPRESSION: 1. New 7 mm of anterolisthesis at L5-S1, indeterminate. Findings may
be degenerative. Consider follow-up MRI to further assess.
2. New mild degenerative disc space narrowing at L5-S1.

## 2022-05-29 DIAGNOSIS — M5416 Radiculopathy, lumbar region: Secondary | ICD-10-CM | POA: Diagnosis not present

## 2022-06-17 DIAGNOSIS — M5416 Radiculopathy, lumbar region: Secondary | ICD-10-CM | POA: Diagnosis not present

## 2022-09-10 DIAGNOSIS — H40023 Open angle with borderline findings, high risk, bilateral: Secondary | ICD-10-CM | POA: Diagnosis not present

## 2022-09-10 DIAGNOSIS — H25813 Combined forms of age-related cataract, bilateral: Secondary | ICD-10-CM | POA: Diagnosis not present

## 2022-10-07 DIAGNOSIS — Z13220 Encounter for screening for lipoid disorders: Secondary | ICD-10-CM | POA: Diagnosis not present

## 2022-10-07 DIAGNOSIS — R079 Chest pain, unspecified: Secondary | ICD-10-CM | POA: Diagnosis not present

## 2022-10-07 DIAGNOSIS — R0989 Other specified symptoms and signs involving the circulatory and respiratory systems: Secondary | ICD-10-CM | POA: Diagnosis not present

## 2022-10-07 DIAGNOSIS — Z0000 Encounter for general adult medical examination without abnormal findings: Secondary | ICD-10-CM | POA: Diagnosis not present

## 2022-10-07 DIAGNOSIS — E782 Mixed hyperlipidemia: Secondary | ICD-10-CM | POA: Diagnosis not present

## 2022-10-08 ENCOUNTER — Ambulatory Visit
Admission: RE | Admit: 2022-10-08 | Discharge: 2022-10-08 | Disposition: A | Discharge status: Home / Self Care | Payer: BC Managed Care – PPO | Source: Ambulatory Visit | Attending: Physician Assistant | Admitting: Physician Assistant

## 2022-10-08 ENCOUNTER — Other Ambulatory Visit: Payer: Self-pay | Admitting: Physician Assistant

## 2022-10-08 DIAGNOSIS — R079 Chest pain, unspecified: Secondary | ICD-10-CM

## 2022-10-08 DIAGNOSIS — R0989 Other specified symptoms and signs involving the circulatory and respiratory systems: Secondary | ICD-10-CM

## 2022-10-23 DIAGNOSIS — D649 Anemia, unspecified: Secondary | ICD-10-CM | POA: Diagnosis not present

## 2022-10-24 ENCOUNTER — Ambulatory Visit
Admission: RE | Admit: 2022-10-24 | Discharge: 2022-10-24 | Disposition: A | Discharge status: Home / Self Care | Payer: BC Managed Care – PPO | Source: Ambulatory Visit | Attending: Physician Assistant | Admitting: Physician Assistant

## 2022-10-24 DIAGNOSIS — R0989 Other specified symptoms and signs involving the circulatory and respiratory systems: Secondary | ICD-10-CM

## 2022-11-08 ENCOUNTER — Encounter: Payer: Self-pay | Admitting: Cardiology

## 2022-11-08 ENCOUNTER — Ambulatory Visit: Payer: BC Managed Care – PPO | Admitting: Cardiology

## 2022-11-08 VITALS — BP 125/84 | HR 57 | Resp 16 | Ht 65.0 in | Wt 200.0 lb

## 2022-11-08 DIAGNOSIS — R072 Precordial pain: Secondary | ICD-10-CM | POA: Diagnosis not present

## 2022-11-08 NOTE — Progress Notes (Signed)
ID:  Yolanda Roberson, DOB Nov 04, 1960, MRN [id number]  PCP:  Aliene Beams, MD  Cardiologist:  Tessa Lerner, DO, [facility] (established care 11/08/22)  REASON FOR CONSULT: Chest Pain   REQUESTING PHYSICIAN:  Aliene Beams, MD [phone] [address] [place],  [place] [address]  Chief Complaint  Patient presents with   Chest Pain   New Patient (Initial Visit)    HPI  Yolanda Roberson is a 62 y.o. African-American female who presents to the clinic for evaluation of chest pain at the request of Aliene Beams, MD. Her past medical history and cardiovascular risk factors include: Glaucoma.   Patient is referred to the practice for evaluation of chest pain.  Onset: June 2024. Occurs intermittently. Substernally located. Lasting 5 to 7 minutes. Sharp/tightness like sensation. Intensity 7 out of 10. Nonradiating. Occurs randomly. Not brought on by effort related activities, does not resolve with rest. Self-limited. Patient states that the symptoms lasted longer in June and now when they occur they are short-lived.  No family history of premature coronary artery disease or sudden cardiac death.  No structured exercise program or daily routine.  CARDIAC DATABASE: EKG: November 08, 2022: Sinus bradycardia, 54 bpm, without underlying ischemia or injury pattern.  Echocardiogram: No results found for this or any previous visit from the past 1095 days.   Stress Testing: No results found for this or any previous visit from the past 1095 days   ALLERGIES: No Known Allergies  MEDICATION LIST PRIOR TO VISIT: Current Meds  Medication Sig   acetaminophen (TYLENOL) 500 MG tablet Take 500 mg by mouth every 6 (six) hours as needed.   aspirin EC 81 MG tablet Take 81 mg by mouth as needed. Swallow whole.   ibuprofen (ADVIL) 200 MG tablet Take 200 mg by mouth every 6 (six) hours as needed.   methocarbamol (ROBAXIN) 750 MG tablet Take 750 mg by mouth 3 (three) times daily as needed.    polyethylene glycol (GOLYTELY) 236 g solution See admin instructions.     PAST MEDICAL HISTORY: History reviewed. No pertinent past medical history.  PAST SURGICAL HISTORY: History reviewed. No pertinent surgical history.  FAMILY HISTORY: The patient family history is not on file.  SOCIAL HISTORY:  The patient  reports that she has never smoked. She does not have any smokeless tobacco history on file. She reports that she does not drink alcohol and does not use drugs.  REVIEW OF SYSTEMS: Review of Systems  Cardiovascular:  Positive for chest pain. Negative for claudication, dyspnea on exertion, irregular heartbeat, leg swelling, near-syncope, orthopnea, palpitations, paroxysmal nocturnal dyspnea and syncope.  Respiratory:  Negative for shortness of breath.   Hematologic/Lymphatic: Negative for bleeding problem.  Musculoskeletal:  Negative for muscle cramps and myalgias.       Left shoulder pain.  Neurological:  Positive for headaches (chronic - years). Negative for dizziness and light-headedness.    PHYSICAL EXAM:    11/08/2022    1:38 PM 12/12/2021   11:09 AM 09/03/2013    6:54 PM  Vitals with BMI  Height 5\' 5"  5\' 5"    Weight 200 lbs 204 lbs   BMI 33.28 33.95   Systolic 125 118 272  Diastolic 84 81 81  Pulse 57 61 50    Physical Exam  Constitutional: No distress.  Age appropriate, hemodynamically stable.   Neck: No JVD present.  Cardiovascular: Normal rate, regular rhythm, S1 normal, S2 normal, intact distal pulses and normal pulses. Exam reveals no gallop, no S3 and  no S4.  No murmur heard. Pulmonary/Chest: Effort normal and breath sounds normal. No stridor. She has no wheezes. She has no rales.  Abdominal: Soft. Bowel sounds are normal. She exhibits no distension. There is no abdominal tenderness.  Musculoskeletal:        General: No edema.     Cervical back: Neck supple.  Neurological: She is alert and oriented to person, place, and time. She has intact cranial  nerves (2-12).  Skin: Skin is warm and moist.   LABORATORY DATA: External Labs: Collected: October 07, 2022 provided by referring physician. Total cholesterol 185, triglycerides 81, HDL 41, calculated LDL 128, non-HDL 143, Hemoglobin 11.7, hematocrit 36.1. BUN 10, creatinine 0.62 Sodium 139, potassium 3.9, chloride 103, bicarb 27 AST 20, ALT 18, alkaline phosphatase 65   IMPRESSION:    ICD-10-CM   1. Precordial pain  R07.2 EKG 12-Lead    PCV CARDIAC STRESS TEST    PCV ECHOCARDIOGRAM COMPLETE    CT CARDIAC SCORING (SELF PAY ONLY)       RECOMMENDATIONS: Yolanda Roberson is a 62 y.o. African-American female whose past medical history and cardiac risk factors include: Glaucoma.  Precordial pain Symptoms predominately noncardiac. EKG: Nonischemic. Echo will be ordered to evaluate for structural heart disease and left ventricular systolic function. Exercise treadmill stress test to evaluate for functional capacity and exercise-induced arrhythmia/ischemia Coronary calcium score for further risk stratification. Further recommendations to follow. Educated on seeking medical attention sooner by going to the closest ER via EMS if the symptoms increase in intensity, frequency, duration, or has typical chest pain as discussed in the office.  Patient verbalized understanding.   FINAL MEDICATION LIST END OF ENCOUNTER: No orders of the defined types were placed in this encounter.   Medications Discontinued During This Encounter  Medication Reason   methocarbamol (ROBAXIN) 500 MG tablet    meloxicam (MOBIC) 15 MG tablet    traMADol (ULTRAM) 50 MG tablet      Current Outpatient Medications:    acetaminophen (TYLENOL) 500 MG tablet, Take 500 mg by mouth every 6 (six) hours as needed., Disp: , Rfl:    aspirin EC 81 MG tablet, Take 81 mg by mouth as needed. Swallow whole., Disp: , Rfl:    ibuprofen (ADVIL) 200 MG tablet, Take 200 mg by mouth every 6 (six) hours as needed., Disp: , Rfl:     methocarbamol (ROBAXIN) 750 MG tablet, Take 750 mg by mouth 3 (three) times daily as needed., Disp: , Rfl:    polyethylene glycol (GOLYTELY) 236 g solution, See admin instructions., Disp: , Rfl:   Orders Placed This Encounter  Procedures   CT CARDIAC SCORING (SELF PAY ONLY)   PCV CARDIAC STRESS TEST   EKG 12-Lead   PCV ECHOCARDIOGRAM COMPLETE    There are no Patient Instructions on file for this visit.   --Continue cardiac medications as reconciled in final medication list. --Return in about 5 weeks (around 12/13/2022) for Reevaluation of, Chest pain. or sooner if needed. --Continue follow-up with your primary care physician regarding the management of your other chronic comorbid conditions.  Patient's questions and concerns were addressed to her satisfaction. She voices understanding of the instructions provided during this encounter.   This note was created using a voice recognition software as a result there may be grammatical errors inadvertently enclosed that do not reflect the nature of this encounter. Every attempt is made to correct such errors.  Tessa Lerner, [place], [facility]  Pager:  [phone] Office: [phone]

## 2022-11-15 ENCOUNTER — Ambulatory Visit (HOSPITAL_COMMUNITY)
Admission: RE | Admit: 2022-11-15 | Discharge: 2022-11-15 | Disposition: A | Discharge status: Home / Self Care | Payer: BC Managed Care – PPO | Source: Ambulatory Visit | Attending: Cardiology | Admitting: Cardiology

## 2022-11-15 DIAGNOSIS — R072 Precordial pain: Secondary | ICD-10-CM | POA: Insufficient documentation

## 2022-12-06 ENCOUNTER — Ambulatory Visit: Payer: BC Managed Care – PPO

## 2022-12-06 DIAGNOSIS — R072 Precordial pain: Secondary | ICD-10-CM | POA: Diagnosis not present

## 2022-12-19 ENCOUNTER — Ambulatory Visit: Payer: BC Managed Care – PPO

## 2022-12-19 DIAGNOSIS — R072 Precordial pain: Secondary | ICD-10-CM

## 2022-12-20 NOTE — Progress Notes (Signed)
Called patient, Na, LMAM.

## 2022-12-23 ENCOUNTER — Other Ambulatory Visit: Payer: BC Managed Care – PPO

## 2022-12-26 NOTE — Progress Notes (Signed)
Called and spoke with patient regarding her stress test results.

## 2023-01-06 ENCOUNTER — Ambulatory Visit: Payer: BC Managed Care – PPO | Admitting: Cardiology

## 2023-01-27 ENCOUNTER — Ambulatory Visit: Payer: BC Managed Care – PPO | Admitting: Cardiology

## 2023-03-14 DIAGNOSIS — H40023 Open angle with borderline findings, high risk, bilateral: Secondary | ICD-10-CM | POA: Diagnosis not present

## 2023-03-14 DIAGNOSIS — H25813 Combined forms of age-related cataract, bilateral: Secondary | ICD-10-CM | POA: Diagnosis not present

## 2023-03-28 DIAGNOSIS — Z1231 Encounter for screening mammogram for malignant neoplasm of breast: Secondary | ICD-10-CM | POA: Diagnosis not present

## 2023-03-28 DIAGNOSIS — Z01419 Encounter for gynecological examination (general) (routine) without abnormal findings: Secondary | ICD-10-CM | POA: Diagnosis not present

## 2023-03-28 DIAGNOSIS — Z124 Encounter for screening for malignant neoplasm of cervix: Secondary | ICD-10-CM | POA: Diagnosis not present

## 2023-04-22 ENCOUNTER — Ambulatory Visit: Payer: BC Managed Care – PPO | Attending: Cardiology | Admitting: Cardiology

## 2023-04-22 ENCOUNTER — Encounter: Payer: Self-pay | Admitting: Cardiology

## 2023-04-22 VITALS — BP 126/86 | HR 64 | Ht 67.0 in | Wt 201.4 lb

## 2023-04-22 DIAGNOSIS — Z712 Person consulting for explanation of examination or test findings: Secondary | ICD-10-CM | POA: Diagnosis not present

## 2023-04-22 DIAGNOSIS — R072 Precordial pain: Secondary | ICD-10-CM | POA: Diagnosis not present

## 2023-04-22 NOTE — Patient Instructions (Addendum)
 Medication Instructions: Stop Aspirin   *If you need a refill on your cardiac medications before your next appointment, please call your pharmacy*   Lab Work:  If you have labs (blood work) drawn today and your tests are completely normal, you will receive your results only by: MyChart Message (if you have MyChart) OR A paper copy in the mail If you have any lab test that is abnormal or we need to change your treatment, we will call you to review the results.   Testing/Procedures:    Follow-Up: At [facility], you and your health needs are our priority.  As part of our continuing mission to provide you with exceptional heart care, we have created designated Provider Care Teams.  These Care Teams include your primary Cardiologist (physician) and Advanced Practice Providers (APPs -  Physician Assistants and Nurse Practitioners) who all work together to provide you with the care you need, when you need it.  We recommend signing up for the patient portal called MyChart.  Sign up information is provided on this After Visit Summary.  MyChart is used to connect with patients for Virtual Visits (Telemedicine).  Patients are able to view lab/test results, encounter notes, upcoming appointments, etc.  Non-urgent messages can be sent to your provider as well.   To learn more about what you can do with MyChart, go to [email or web address].    Your next appointment: as needed

## 2023-04-22 NOTE — Progress Notes (Signed)
 Cardiology Office Note:  .   Date:  04/22/2023  ID:  Maurilio Lipa, DOB Nov 18, 1960, MRN [id number] PCP:  Rolinda Millman, MD  Former Cardiology Providers: NA [facility] Providers Cardiologist:  None , [facility] (established care 11/08/2022) Electrophysiologist:  None  Click to update primary MD,subspecialty MD or APP then REFRESH:1}    Chief Complaint  Patient presents with   Follow-up    Reevaluation of chest pain.     History of Present Illness: .   Yolanda Roberson is a 63 y.o. African-American female whose past medical history and cardiovascular risk factors includes: Glaucoma.  Patient was referred to the practice for evaluation of precordial pain.  Her symptoms were predominantly noncardiac.  However the shared decision was to proceed forward with workup.  Her coronary calcium score noted to be 0, echocardiogram notes preserved LVEF, and exercise treadmill stress test low risk.  Patient presents today for follow-up.  No recurrence of precordial pain since last office visit.  Review of Systems: .   Review of Systems  Cardiovascular:  Negative for chest pain, claudication, irregular heartbeat, leg swelling, near-syncope, orthopnea, palpitations, paroxysmal nocturnal dyspnea and syncope.  Respiratory:  Negative for shortness of breath.   Hematologic/Lymphatic: Negative for bleeding problem.    Studies Reviewed:   EKG: November 08, 2022: Sinus bradycardia, 54 bpm, without underlying ischemia or injury pattern.  Echocardiogram: 12/19/2022: Normal LV systolic function with visual EF 60-65%. Left ventricle cavity is normal in size. Normal left ventricular wall thickness. Normal global wall motion. Normal diastolic filling pattern, normal LAP. Mild tricuspid regurgitation. No evidence of pulmonary hypertension. Moderate pulmonic regurgitation. No prior study for comparison.   Stress Testing: Treadmill Exercise Stress 12/06/2022: Exercised for 6 minutes and 0 seconds on Bruce  protocol; achieved 7.05 METs at 96% of APMHR  Stress EKG negative for ischemia.  See report for additional details  CT Cardiac Scoring: November 18, 2022 Total CAC 0 AU. Noncardiac findings: small hiatal hernia.   RADIOLOGY: NA  Risk Assessment/Calculations:   NA   Labs:    External Labs: Collected: October 07, 2022 provided by referring physician. Total cholesterol 185, triglycerides 81, HDL 41, calculated LDL 128, non-HDL 143, Hemoglobin 11.7, hematocrit 36.1. BUN 10, creatinine 0.62 Sodium 139, potassium 3.9, chloride 103, bicarb 27 AST 20, ALT 18, alkaline phosphatase 65  Physical Exam:    Today's Vitals   04/22/23 0822  BP: 126/86  Pulse: 64  SpO2: 98%  Weight: 201 lb 6.4 oz (91.4 kg)  Height: 5' 7 (1.702 m)   Body mass index is 31.54 kg/m. Wt Readings from Last 3 Encounters:  04/22/23 201 lb 6.4 oz (91.4 kg)  11/08/22 200 lb (90.7 kg)  12/12/21 204 lb (92.5 kg)    Physical Exam  Constitutional: No distress.  hemodynamically stable  Neck: No JVD present.  Cardiovascular: Normal rate, regular rhythm, S1 normal and S2 normal. Exam reveals no gallop, no S3 and no S4.  No murmur heard. Pulmonary/Chest: Effort normal and breath sounds normal. No stridor. She has no wheezes. She has no rales.  Abdominal: Soft. Bowel sounds are normal. She exhibits no distension. There is no abdominal tenderness.  Musculoskeletal:        General: No edema.     Cervical back: Neck supple.  Neurological: She is alert and oriented to person, place, and time. She has intact cranial nerves (2-12).  Skin: Skin is warm.   Impression & Recommendation(s):  Impression:   ICD-10-CM   1. Precordial pain  R07.2     2. Encounter to discuss test results  Z71.2        Recommendation(s):  Precordial pain: No reoccurrence since last office visit. Prior EKG was nonischemic. Echocardiogram: Preserved LVEF, see report for additional details Exercise treadmill stress test: Low risk  study Coronary calcium score: 0 Discontinue aspirin 81 mg p.o. daily. Reemphasized the importance of primary prevention with focus on improving her modifiable cardiovascular risk factors such as glycemic control, lipid management, blood pressure control, weight loss.  For cardiovascular standpoint no additional testing warranted at this time.  Would like to see her back on as-needed basis.  Patient agreeable with the plan of care.  Her questions and concerns addressed to her satisfaction.  Orders Placed:  No orders of the defined types were placed in this encounter.   Final Medication List:   No orders of the defined types were placed in this encounter.   Medications Discontinued During This Encounter  Medication Reason   aspirin EC 81 MG tablet Discontinued by provider     Current Outpatient Medications:    acetaminophen (TYLENOL) 500 MG tablet, Take 500 mg by mouth every 6 (six) hours as needed., Disp: , Rfl:    ibuprofen (ADVIL) 200 MG tablet, Take 200 mg by mouth every 6 (six) hours as needed., Disp: , Rfl:    methocarbamol  (ROBAXIN ) 750 MG tablet, Take 750 mg by mouth 3 (three) times daily as needed., Disp: , Rfl:    polyethylene glycol (GOLYTELY) 236 g solution, See admin instructions., Disp: , Rfl:   Consent:   N/A  Disposition:   As needed as needed Patient may be asked to follow-up sooner based on the results of the above-mentioned testing.  Her questions and concerns were addressed to her satisfaction. She voices understanding of the recommendations provided during this encounter.    Signed, Madonna Large, DO, [facility]  [facility]  [address] [place], [place] [address] 04/22/2023 8:50 AM

## 2023-10-14 DIAGNOSIS — Z6839 Body mass index (BMI) 39.0-39.9, adult: Secondary | ICD-10-CM | POA: Diagnosis not present

## 2023-10-14 DIAGNOSIS — E559 Vitamin D deficiency, unspecified: Secondary | ICD-10-CM | POA: Diagnosis not present

## 2023-10-14 DIAGNOSIS — Z13220 Encounter for screening for lipoid disorders: Secondary | ICD-10-CM | POA: Diagnosis not present

## 2023-10-14 DIAGNOSIS — Z7185 Encounter for immunization safety counseling: Secondary | ICD-10-CM | POA: Diagnosis not present

## 2023-10-14 DIAGNOSIS — Z0000 Encounter for general adult medical examination without abnormal findings: Secondary | ICD-10-CM | POA: Diagnosis not present

## 2023-10-24 ENCOUNTER — Encounter: Payer: Self-pay | Admitting: Advanced Practice Midwife

## 2023-11-24 DIAGNOSIS — H40023 Open angle with borderline findings, high risk, bilateral: Secondary | ICD-10-CM | POA: Diagnosis not present

## 2023-11-24 DIAGNOSIS — H25813 Combined forms of age-related cataract, bilateral: Secondary | ICD-10-CM | POA: Diagnosis not present
